# Patient Record
Sex: Male | Born: 1955
Health system: Southern US, Community
[De-identification: ages and names within clinical notes are randomized; demographics above are authoritative.]

## PROBLEM LIST (undated history)

## (undated) DIAGNOSIS — E119 Type 2 diabetes mellitus without complications: Secondary | ICD-10-CM

## (undated) DIAGNOSIS — H409 Unspecified glaucoma: Secondary | ICD-10-CM

## (undated) DIAGNOSIS — G4730 Sleep apnea, unspecified: Secondary | ICD-10-CM

## (undated) HISTORY — PX: ANTERIOR CERVICAL DISCECTOMY: SHX1160

## (undated) HISTORY — PX: KNEE ARTHROSCOPY: SUR90

## (undated) HISTORY — DX: Sleep apnea, unspecified: G47.30

## (undated) HISTORY — DX: Type 2 diabetes mellitus without complications: E11.9

---

## 1997-10-07 ENCOUNTER — Ambulatory Visit (HOSPITAL_COMMUNITY): Admission: RE | Admit: 1997-10-07 | Discharge: 1997-10-07 | Payer: Self-pay | Admitting: Family Medicine

## 1997-10-10 ENCOUNTER — Encounter: Payer: Self-pay | Admitting: Urology

## 1997-10-10 ENCOUNTER — Ambulatory Visit (HOSPITAL_COMMUNITY): Admission: RE | Admit: 1997-10-10 | Discharge: 1997-10-10 | Payer: Self-pay | Admitting: Urology

## 1997-10-20 ENCOUNTER — Encounter: Payer: Self-pay | Admitting: Family Medicine

## 1997-10-20 ENCOUNTER — Ambulatory Visit (HOSPITAL_COMMUNITY): Admission: RE | Admit: 1997-10-20 | Discharge: 1997-10-20 | Payer: Self-pay | Admitting: Family Medicine

## 1997-11-10 ENCOUNTER — Ambulatory Visit (HOSPITAL_COMMUNITY): Admission: RE | Admit: 1997-11-10 | Discharge: 1997-11-10 | Payer: Self-pay | Admitting: Urology

## 1997-11-10 ENCOUNTER — Encounter: Payer: Self-pay | Admitting: Urology

## 2001-05-04 ENCOUNTER — Ambulatory Visit (HOSPITAL_COMMUNITY): Admission: RE | Admit: 2001-05-04 | Discharge: 2001-05-04 | Payer: Self-pay | Admitting: Gastroenterology

## 2005-06-18 ENCOUNTER — Ambulatory Visit (HOSPITAL_BASED_OUTPATIENT_CLINIC_OR_DEPARTMENT_OTHER): Admission: RE | Admit: 2005-06-18 | Discharge: 2005-06-18 | Payer: Self-pay | Admitting: Family Medicine

## 2005-07-23 ENCOUNTER — Ambulatory Visit (HOSPITAL_BASED_OUTPATIENT_CLINIC_OR_DEPARTMENT_OTHER): Admission: RE | Admit: 2005-07-23 | Discharge: 2005-07-23 | Payer: Self-pay | Admitting: Family Medicine

## 2005-07-28 ENCOUNTER — Ambulatory Visit: Payer: Self-pay | Admitting: Internal Medicine

## 2006-12-02 ENCOUNTER — Ambulatory Visit (HOSPITAL_COMMUNITY): Admission: RE | Admit: 2006-12-02 | Discharge: 2006-12-02 | Payer: Self-pay | Admitting: Gastroenterology

## 2007-04-21 ENCOUNTER — Ambulatory Visit (HOSPITAL_COMMUNITY): Admission: RE | Admit: 2007-04-21 | Discharge: 2007-04-21 | Payer: Self-pay | Admitting: Neurological Surgery

## 2007-04-30 ENCOUNTER — Ambulatory Visit (HOSPITAL_COMMUNITY): Admission: RE | Admit: 2007-04-30 | Discharge: 2007-05-01 | Payer: Self-pay | Admitting: Neurological Surgery

## 2009-06-20 ENCOUNTER — Ambulatory Visit: Payer: Self-pay | Admitting: Cardiology

## 2009-06-20 ENCOUNTER — Observation Stay (HOSPITAL_COMMUNITY): Admission: EM | Admit: 2009-06-20 | Discharge: 2009-06-20 | Payer: Self-pay | Admitting: Emergency Medicine

## 2010-05-01 LAB — POCT CARDIAC MARKERS
Troponin i, poc: <0.05 ng/mL (ref 0.00–0.09)
Troponin i, poc: <0.05 ng/mL (ref 0.00–0.09)

## 2010-05-01 LAB — DIFFERENTIAL
Basophils Relative: 0 % (ref 0–1)
Eosinophils Absolute: 0.1 10*3/uL (ref 0.0–0.7)
Eosinophils Relative: 1 % (ref 0–5)
Monocytes Absolute: 0.6 10*3/uL (ref 0.1–1.0)
Monocytes Relative: 8 % (ref 3–12)

## 2010-05-01 LAB — POCT I-STAT, CHEM 8
BUN: 12 mg/dL (ref 6–23)
Calcium, Ion: 1.13 mmol/L (ref 1.12–1.32)
Chloride: 104 mEq/L (ref 96–112)
Glucose, Bld: 88 mg/dL (ref 70–99)

## 2010-05-01 LAB — CBC
Hemoglobin: 15.4 g/dL (ref 13.0–17.0)
MCHC: 34.1 g/dL (ref 30.0–36.0)
MCV: 87.1 fL (ref 78.0–100.0)
RBC: 5.18 MIL/uL (ref 4.22–5.81)

## 2010-05-01 LAB — CK TOTAL AND CKMB (NOT AT ARMC)
Relative Index: 1.5 (ref 0.0–2.5)
Total CK: 401 U/L — ABNORMAL HIGH (ref 7–232)

## 2010-05-01 LAB — TROPONIN I: Troponin I: 0.02 ng/mL (ref 0.00–0.06)

## 2010-06-26 NOTE — Op Note (Signed)
NAME:  Nicholas Young, Nicholas Young                ACCOUNT NO.:  [id number]   MEDICAL RECORD NO.:  [id number]          PATIENT TYPE:  AMB   LOCATION:  ENDO                         FACILITY:  [facility]   PHYSICIAN:  John C. Madilyn Fireman, M.D.    DATE OF BIRTH:  01-23-1956   DATE OF PROCEDURE:  12/02/2006  DATE OF DISCHARGE:                               OPERATIVE REPORT   PROCEDURE PERFORMED:  Colonoscopy.   INDICATIONS FOR PROCEDURE:  Family history of colon cancer in a first  degree relative.   PROCEDURE:  The patient was placed in the left lateral decubitus  position and placed on the pulse monitor with continuous low flow oxygen  delivered by nasal cannula.  He was sedated with 70 mcg IV fentanyl and  7 mg IV Versed.  The Olympus video colonoscope was inserted into the  rectum and advanced to the cecum, confirmed by transillumination of  McBurney's point and visualization of the ileocecal valve and  appendiceal orifice.  The prep was good.  The cecum, ascending,  transverse and descending colon all appeared normal with no masses,  polyps, diverticula or other mucosal abnormalities.  In the sigmoid  colon, there were several scattered diverticula, no other abnormalities.  The rectum appeared normal.  Retroflexed view of the anus revealed no  obvious internal hemorrhoids.  The scope was then withdrawn and the  patient returned to the recovery room in stable condition.  He tolerated  the procedure well.  There were no immediate complications.   IMPRESSION:  A few diverticula, otherwise, normal study.   PLAN:  Will repeat colonoscopy in five years.           ______________________________  Everardo All Madilyn Fireman, M.D.     JCH/MEDQ  D:  12/02/2006  T:  12/02/2006  Job:  [id number]   cc:   Bryan Lemma. Manus Gunning, M.D.  Fax: [phone]

## 2010-06-26 NOTE — Op Note (Signed)
NAME:  JRU, PENSE NO.:  [id number]   MEDICAL RECORD NO.:  [id number]          PATIENT TYPE:  OIB   LOCATION:  3533                         FACILITY:  [facility]   PHYSICIAN:  Stefani Dama, M.D.  DATE OF BIRTH:  May 28, 1955   DATE OF PROCEDURE:  04/30/2007  DATE OF DISCHARGE:                               OPERATIVE REPORT   PREOPERATIVE DIAGNOSIS:  Cervical spondylosis with myelopathy and  radiculopathy C3-4, C4-5 and C5-6.   POSTOPERATIVE DIAGNOSIS:  Cervical spondylosis with myelopathy and  radiculopathy C3-4, C4-5 and C5-6.   OPERATION:  1. Anterior cervical decompression C3-4, C4-5, and C5-6.  2. Arthroplasty with Synthes pro disk C3-4.  3. Arthrodesis C4-5 and C5-6 with structural allograft and alpha-type      plate fixation Z6-X0.   SURGEON:  Stefani Dama, M.D.   FIRST ASSISTANT:  Cristi Loron, M.D.   ANESTHESIA:  General endotracheal.   INDICATIONS:  Nicholas Young is a 55 year old individual who has had  significant problems with pain, numbness, and weakness now on the right  upper extremity.  The numbness has been going on for a period of months,  but his weakness was more recently noted in the past 4 or 5 weeks.  He  was found to have severe spondylitic myelopathy with cord compression at  the level of C3-4 and C4-5.  He has moderate central canal stenosis at  C5-C6 with by foraminal stenosis of the C6 nerve roots.  After  evaluation was noted that he had weakness in both the C5 and C6  distributions, he was advised regarding surgical decompression  arthrodesis.   PROCEDURE:  The patient was brought to the operating room, placed on the  table in the supine position.  After smooth induction of general  endotracheal anesthesia he was placed in 5 pounds of halter traction,  and the neck was carefully prepped with alcohol and DuraPrep and draped  in a sterile fashion.  A transverse incision was made in the superior  portion of neck after  localizing the area on the skin just above the  level of C3-4.  Dissection was taken down through the platysma and the  plane between the sternocleidomastoid and the strap muscles was then  dissected until the prevertebral space was reached.  The disk space at  C3-C4 was identified, and localized both in the AP and lateral  projections using needle localization with fluoroscopy.  The midline was  carefully marked at this level.  The self-retaining retractors with the  Caspar blades being used, under the longus coli muscle at the level of  C3-C4 were placed laterally, and a blunt Caspar distractor was placed  superiorly-inferiorly.   End plates were then chosen, again, using fluoroscopic placement to  assure that pins were being placed in the superior portion of the  vertebral body of C3, and the inferior portion of the vertebral body of  C4.  Pin distractors were then applied, and a slight amount of  distraction was applied.  The anterior longitudinal ligament was then  opened with a #15 blade, and a  complete diskectomy was then performed at  the C3-C4 level using a combination of the Kerrison rongeurs to remove a  substantial quantity of severely degenerated disk material.  In the  region of the posterior longitudinal ligament there was noted to be a  breech in the ligament itself, and there were free fragments of disk  material in the epidural space.  These were retrieved with a small nerve  hook and a micro pituitary.  The disk space was cleaned of overgrown  ligament, and the lateral aspect on the right side was taken up with a 1  mm and then a 2 mm Kerrison punch.  Care was taken to make sure that the  lateral recess was well decompressed, particularly on the right side.  The left side was decompressed also taking up the ligament, and making  sure that the dura was well decompressed.   Once the decompression portion was accomplished, the high-speed drill  was used to drill off some  of the irregularities in the bony edges  particularly from the inferior margin body of body of C3.  With the  osteophytes being removed, the interspace was sized, and it was felt  that a large spacer, 5 mm in height with the deep 16 mm contour would  fit best into this interspace.  The trial was then placed into the  interspace and tamped and countersunk appropriately.  Once this spacer  was placed well, and no adjustments were needed, the distraction was  released along the spacer.   Keel cuts were then made with the keel cutting drill bit, the superior  and inferior keels.  The keels were then checked for their completeness  of cut; and once this was ascertained, the arthroplasty implant was  placed into the interspace.  Again, this was a large implant with a 16  mm deep AP measurement.  The height was 5 mm tall.  The implant was  driven and countersunk using fluoroscopic guidance.  Once it was placed,  the applicator tool was released from the implant.  Final localizing  radiographs were obtained in the AP and lateral projection.  At this  point, some bone wax was placed over the exposed anterior bony edges,  and then attention was turned to C4-C5 and C5-6.  At C4-5 there was some  ventral osteophytes that were resected and the C4-5 disk space was  entered after placing a self-retaining Caspar retractor in this area.  The complete diskectomy was completed with an aggressive lateral  decompression of osteophyte from the region of the uncinate process.  The left side was similarly decompressed.  Here an 8-mm trans graft was  cut and sized into the appropriate shape to fit into the interspace.  It  was filled with fortoss bone substitute.   Attention was then turned to C5-C6 where a similar diskectomy was  carried out.  Here there was noted to be biforaminal stenosis, and  aggressive diskectomy and foraminotomy was created removing uncinate  spurs from C6 primarily.  Again, once this  disk space was completed, an  8 mm size trans graft was cut, sized, and filled with fortoss and then  placed into the interspace.  This procedure was done without the aid of  any traction.  Traction was removed at the time of the arthroplasty  being performed at C3-4.  With bone grafts being placed, the area was  sized for a 37 mm standard size Alphatec trestle plate, and the plate  was  secured in the ventral aspect of the vertebral bodies with variable  angle 14 mm screws.   Hemostasis was then carefully checked in the tissues.  Final localizing  radiograph identified good position of the graft, in the arthroplasty.  Then the platysma was closed with 3-0 Vicryl in an interrupted fashion,  3-0 Vicryl was used in the subcuticular tissues, and a dry sterile  dressing was placed on the skin.  The patient tolerated the procedure  well and was returned to the recovery room in stable condition.      Stefani Dama, M.D.  Electronically Signed     HJE/MEDQ  D:  04/30/2007  T:  04/30/2007  Job:  [id number]

## 2010-06-29 NOTE — Procedures (Signed)
NAME:  Nicholas Young, Nicholas Young                ACCOUNT NO.:  [id number]   MEDICAL RECORD NO.:  [id number]          PATIENT TYPE:  OUT   LOCATION:  SLEEP CENTER                 FACILITY:  [facility]   PHYSICIAN:  Clinton D. Maple Hudson, M.D. DATE OF BIRTH:  03-02-1955   DATE OF STUDY:  07/23/2005                              NOCTURNAL POLYSOMNOGRAM   REFERRING PHYSICIAN:  Dr. Blair Heys.   INDICATIONS FOR PROCEDURE:  Hypersomnia with sleep apnea.   EPWORTH SLEEPINESS SCORE:  10/24.   BMI:  36.   WEIGHT:  260 pounds.   HOME MEDICATION:  Citalopram, Rhinocort Aqua.   A baseline diagnostic NPSG on 06/18/2005 recorded an AHI of 108.2 per hour.  CPAP titration is requested.   SLEEP ARCHITECTURE:  Total sleep time 401 minutes with sleep efficiency 86%.  Stage I was 3%, stage II 66%, stages III and IV 11%, REM 21% of total sleep  time.  Sleep latency 27 minutes, REM latency 233 minutes, awake after sleep  onset 38 minutes, arousal index 9.4.  No sleep medication was reported.   RESPIRATORY DATA:  CPAP titration protocol.  CPAP was titrated to 14 CWP, as  a AHI 2.6 per hour.  He used his own medium full-face ultra mirage mask with  heated humidifier.   OXYGEN DATA:  Snoring was controlled and oxygen saturation maintained at 94-  96% on room air while on CPAP.   CARDIAC DATA:  Normal sinus rhythm.   MOVEMENT/PARASOMNIA:  Frequent limb jerks with a total of 172 reported of  which only five were associated with arousal or awakening for an  insignificant periodic limb movement with arousal index of 0.7 per hour.   IMPRESSION/RECOMMENDATIONS:  1.  Successful CPAP titration to 14 CWP, AHI 2.6 per hour.  A medium full-      face ultra mirage mask was used      with heated humidity.  2.  Baseline diagnostic NPSG on 06/18/2005 had recorded an AHI of 108.2 per      hour.      Clinton D. Maple Hudson, M.D.  Diplomate, Biomedical engineer of Sleep Medicine  Electronically Signed     CDY/MEDQ  D:  07/28/2005  09:19:59  T:  07/29/2005 10:25:34  Job:  [id number]

## 2010-06-29 NOTE — Procedures (Signed)
NAME:  Nicholas Young, Nicholas Young                ACCOUNT NO.:  [id number]   MEDICAL RECORD NO.:  [id number]          PATIENT TYPE:  OUT   LOCATION:  SLEEP CENTER                 FACILITY:  [facility]   PHYSICIAN:  Clinton D. Maple Hudson, M.D. DATE OF BIRTH:  July 29, 1955   DATE OF STUDY:                              NOCTURNAL POLYSOMNOGRAM   INDICATION FOR STUDY:  Hypersomnia with sleep apnea.   Epworth sleepiness score 11/24, BMI 20.6.  Weight 265 pounds.   HOME MEDICATIONS:  1.  Lexapro.  2.  Rhinocort.   NPSG diagnostic protocol was requested.   SLEEP ARCHITECTURE:  Total sleep time 370 minutes with sleep efficiency 93%.  Stage I was 10%, stage II 79%, stages III and IV were absent.  REM was 11%  of total sleep time.  Sleep latency 15 minutes, REM latency 313 minutes,  awake after sleep onset 19 minutes, arousal index increased at 76.1  indicating increased fragmentation.  No bedtime medication was reported.   RESPIRATORY DATA:  Apnea/hypopnea index (AHI, RDI) 108.2 obstructive events  per hour indicating severe obstructive sleep apnea/hypopnea syndrome.  There  were 2 central apneas, 453 obstructive apneas, 1 mixed apnea and 211  hypopneas.  Events were not positional.  REM AHI 110 per hour, diagnostic  NPSG protocol.   OXYGEN DATA:  Moderate to loud continuous snoring with oxygen desaturation  to a nadir of 82%.   CARDIAC DATA:  Normal sinus rhythm.   MOVEMENT/PARASOMNIA:  Occasional limb jerk with little effect on sleep.   IMPRESSION/RECOMMENDATIONS:  1.  Severe obstructive sleep apnea/hypopnea syndrome, AHI 108.2 per hour      with non-positional events, moderate      to loud snoring, and oxygen desaturation to a nadir of 82%.  2.  Consider return for CPAP titration or evaluate for alternative therapies      as appropriate.      Clinton D. Maple Hudson, M.D.  Diplomate, Biomedical engineer of Sleep Medicine  Electronically Signed     CDY/MEDQ  D:  06/23/2005 11:16:54  T:  06/24/2005 10:38:15   Job:  [id number]

## 2010-06-29 NOTE — Procedures (Signed)
[facility]  Patient:    Nicholas Young, Nicholas Young Visit Number: [id number] MRN: [id number]          Service Type: Attending:  Everardo All. Madilyn Fireman, M.D. Dictated by:   Everardo All Madilyn Fireman, M.D. Proc. Date: 05/04/01   CC:         Dyanne Carrel, M.D.   Procedure Report  PROCEDURE:  Colonoscopy.  INDICATION FOR PROCEDURE:  Family history of colon cancer in a first degree relative.  DESCRIPTION OF PROCEDURE:   The patient was placed in the left lateral decubitus position and placed on the pulse monitor with continuous low flow oxygen delivered via nasal cannula. He was sedated with 100 mg IV Demerol and 6 mg IV Versed. The Olympus video colonoscope was inserted into the rectum and advanced to the cecum, confirmed by transillumination at McBurneys point and visualization of the ileocecal valve and appendiceal orifice. The prep was good. The terminal ileum was intubated and explored for several centimeters and appeared to be within normal limits. The cecum, ascending, transverse, descending, and sigmoid colon all appeared normal with no masses, polyps, diverticula, or other mucosal abnormalities. The rectum likewise appeared normal and retroflex view of the anus revealed no obvious internal hemorrhoids. The colonoscope was then withdrawn and the patient returned to the recovery room in stable condition. He tolerated the procedure well and there were no immediate complications.  IMPRESSION:  Essentially normal colonoscopy.  PLAN:  Repeat colonoscopy in five years based on his family history. Dictated by:   Everardo All Madilyn Fireman, M.D. Attending:  Everardo All. Madilyn Fireman, M.D. DD:  05/04/01 TD:  05/04/01 Job: [id number]

## 2010-08-15 ENCOUNTER — Inpatient Hospital Stay (INDEPENDENT_AMBULATORY_CARE_PROVIDER_SITE_OTHER)
Admission: RE | Admit: 2010-08-15 | Discharge: 2010-08-15 | Disposition: A | Discharge status: Home / Self Care | Payer: 59 | Source: Ambulatory Visit | Attending: Family Medicine | Admitting: Family Medicine

## 2010-08-15 DIAGNOSIS — L02419 Cutaneous abscess of limb, unspecified: Secondary | ICD-10-CM

## 2010-08-15 DIAGNOSIS — L03119 Cellulitis of unspecified part of limb: Secondary | ICD-10-CM

## 2010-08-15 LAB — DIFFERENTIAL
Eosinophils Relative: 3 % (ref 0–5)
Lymphocytes Relative: 18 % (ref 12–46)
Lymphs Abs: 1.3 10*3/uL (ref 0.7–4.0)
Monocytes Absolute: 1.1 10*3/uL — ABNORMAL HIGH (ref 0.1–1.0)
Neutro Abs: 4.5 10*3/uL (ref 1.7–7.7)

## 2010-08-15 LAB — CBC
HCT: 41.2 % (ref 39.0–52.0)
Hemoglobin: 14.2 g/dL (ref 13.0–17.0)
MCV: 84.6 fL (ref 78.0–100.0)
RDW: 14.2 % (ref 11.5–15.5)
WBC: 7.1 10*3/uL (ref 4.0–10.5)

## 2010-08-17 ENCOUNTER — Inpatient Hospital Stay (INDEPENDENT_AMBULATORY_CARE_PROVIDER_SITE_OTHER)
Admission: RE | Admit: 2010-08-17 | Discharge: 2010-08-17 | Disposition: A | Discharge status: Home / Self Care | Payer: 59 | Source: Ambulatory Visit | Attending: Family Medicine | Admitting: Family Medicine

## 2010-08-17 DIAGNOSIS — M79609 Pain in unspecified limb: Secondary | ICD-10-CM

## 2010-08-17 DIAGNOSIS — M25469 Effusion, unspecified knee: Secondary | ICD-10-CM

## 2010-08-23 ENCOUNTER — Encounter (HOSPITAL_COMMUNITY): Payer: 59 | Attending: Orthopedic Surgery

## 2010-08-23 DIAGNOSIS — L02419 Cutaneous abscess of limb, unspecified: Secondary | ICD-10-CM | POA: Insufficient documentation

## 2010-08-24 ENCOUNTER — Encounter (HOSPITAL_COMMUNITY): Payer: 59

## 2010-08-27 ENCOUNTER — Encounter (HOSPITAL_COMMUNITY): Payer: 59

## 2010-09-24 ENCOUNTER — Inpatient Hospital Stay (INDEPENDENT_AMBULATORY_CARE_PROVIDER_SITE_OTHER)
Admission: RE | Admit: 2010-09-24 | Discharge: 2010-09-24 | Disposition: A | Discharge status: Home / Self Care | Payer: 59 | Source: Ambulatory Visit | Attending: Family Medicine | Admitting: Family Medicine

## 2010-09-24 DIAGNOSIS — H6690 Otitis media, unspecified, unspecified ear: Secondary | ICD-10-CM

## 2010-11-05 LAB — CBC
HCT: 39.9
Hemoglobin: 13.7
MCHC: 34.4
MCV: 86.1
RBC: 4.63

## 2014-05-12 ENCOUNTER — Encounter: Payer: Self-pay | Admitting: Dietician

## 2014-05-12 ENCOUNTER — Encounter: Payer: 59 | Attending: Family Medicine | Admitting: Dietician

## 2014-05-12 VITALS — Ht 71.0 in | Wt 278.2 lb

## 2014-05-12 DIAGNOSIS — E669 Obesity, unspecified: Secondary | ICD-10-CM | POA: Insufficient documentation

## 2014-05-12 DIAGNOSIS — Z6838 Body mass index (BMI) 38.0-38.9, adult: Secondary | ICD-10-CM | POA: Insufficient documentation

## 2014-05-12 DIAGNOSIS — Z713 Dietary counseling and surveillance: Secondary | ICD-10-CM | POA: Insufficient documentation

## 2014-05-12 NOTE — Patient Instructions (Signed)
Think about bringing lunch to work to give you more time to eat. Have a protein the size of the palm of your hand, starch on quarter of your plate and add vegetables to side. If you are hungry, have portioned out snacks with protein and carbs (see list). Think about using a small plate. Try to take 20 minutes to eat. Chew well, put your fork down between bites, eat without multitasking. If you are still hungry after 20 minutes, have seconds. Plan to walk 5 x week for 30 minutes.

## 2014-05-12 NOTE — Progress Notes (Signed)
  Medical Nutrition Therapy:  Appt start time: 1520 end time:  1600.   Assessment:  Primary concerns today: Nicholas Young is here today for help with weight loss and his Hgb A1c has going up ( 6.3% back in the fall). Has not made any major changes to diet or exercise recently. Would like to get weight down to 220 lbs. Has been at his current weight for the last few years and has gone up gradually over the past 20 lbs. Has lost weight a few years ago by decreasing portions and increasing portions. Has episodes of reactive hypoglycemia when he has something sweet.   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 Lives with his wife and both of them prepare meals. Tries not to miss meals and eats about 8-9 meals per week.   Feels like he needs to work on his portion sizes.   Preferred Learning Style:   No preference indicated   Learning Readiness:   Ready  MEDICATIONS: see list   DIETARY INTAKE:  Usual eating pattern includes 3 meals and 0-1 snacks per day.  Avoided foods include liver.    24-hr recall:  B ( AM): slim fast meal bar with black coffee Snk ( AM): none  L ( PM): cafeteria - salad with mixed greens sometimes with tuna or sandwich with a piece of fruit Snk ( PM): none D ( PM): hamburger and fries or chopped salad  Snk ( PM):sometimes might have popcorn Beverages: water  Usual physical activity: walking about 2 x week for 15-20 minutes  Estimated energy needs: 2000 calories 225 g carbohydrates 150 g protein 56 g fat  Progress Towards Goal(s):  In progress.   Nutritional Diagnosis:  -3.3 Overweight/obesity As related to hx of large portion sizes and physical inactivity.  As evidenced by BMI of 38.8 and elevated Hgb A1c.    Intervention:  Nutrition counseling provided. Plan: Think about bringing lunch to work to give you more time to eat. Have a protein the size of the palm of your hand, starch on quarter of your plate and add vegetables to side. If you are hungry,  have portioned out snacks with protein and carbs (see list). Think about using a small plate. Try to take 20 minutes to eat. Chew well, put your fork down between bites, eat without multitasking. If you are still hungry after 20 minutes, have seconds. Plan to walk 5 x week for 30 minutes.   Teaching Method Utilized:  Visual Auditory Hands on  Handouts given during visit include:  MyPlate Handout  15 g CHO Snacks  Meal Card  Barriers to learning/adherence to lifestyle change: none  Demonstrated degree of understanding via:  Teach Back   Monitoring/Evaluation:  Dietary intake, exercise, and body weight in 6 week(s).

## 2014-07-07 ENCOUNTER — Encounter: Payer: 59 | Attending: Family Medicine | Admitting: Dietician

## 2014-07-07 ENCOUNTER — Encounter: Payer: Self-pay | Admitting: Dietician

## 2014-07-07 VITALS — Ht 71.0 in | Wt 268.0 lb

## 2014-07-07 DIAGNOSIS — E669 Obesity, unspecified: Secondary | ICD-10-CM | POA: Insufficient documentation

## 2014-07-07 DIAGNOSIS — Z6838 Body mass index (BMI) 38.0-38.9, adult: Secondary | ICD-10-CM | POA: Diagnosis not present

## 2014-07-07 DIAGNOSIS — Z713 Dietary counseling and surveillance: Secondary | ICD-10-CM | POA: Insufficient documentation

## 2014-07-07 NOTE — Patient Instructions (Addendum)
Continue to have protein the size of the palm of your hand, starch on quarter of your plate and add vegetables to side. Continue having snacks with protein and carbs available. Take take 20 minutes to eat. Chew well, put your fork down between bites, eat without multitasking when possible. If you are still hungry after 20 minutes, have seconds. Plan to walk 6 x week for 30 minutes.

## 2014-07-07 NOTE — Progress Notes (Signed)
  Medical Nutrition Therapy:  Appt start time: 1000 end time:  1010.   Assessment:  Primary concerns today: Prem is here today for help with weight loss and his Hgb A1c has going up ( 6.3% back in the fall). Returns with a 10 lbs weight loss since 3/31. Had another Hgb A1c test done this morning (not sure of result yet). Has been walking every evening and trying to watch portions and increase vegetables. Has added snacks during the day. Has not brought lunch to work and is not sure he will be able to do that.   Overall doing great and happy with what he is doing.   Preferred Learning Style:   No preference indicated   Learning Readiness:   Ready  MEDICATIONS: see list   DIETARY INTAKE:  Usual eating pattern includes 3 meals and 0-1 snacks per day.  Avoided foods include liver.    24-hr recall:  B ( AM): slim fast meal bar with black coffee Snk ( AM): oikos triple zero or [employer] L ( PM): cafeteria - salad with mixed greens sometimes with tuna or sandwich with a piece of fruit Snk ( PM): oikos triple zero or [employer] D ( PM): salad with fish or chicken, broccoli or cauliflower Snk ( PM):none or oikos triple zero or [employer] Beverages: water  Usual physical activity: walking about 6 x week for 30 minutes  Estimated energy needs: 2000 calories 225 g carbohydrates 150 g protein 56 g fat  Progress Towards Goal(s):  In progress.   Nutritional Diagnosis:  -3.3 Overweight/obesity As related to hx of large portion sizes and physical inactivity.  As evidenced by BMI of 38.8 and elevated Hgb A1c.    Intervention:  Nutrition counseling provided. Plan: Think about bringing lunch to work to give you more time to eat. Have a protein the size of the palm of your hand, starch on quarter of your plate and add vegetables to side. If you are hungry, have portioned out snacks with protein and carbs (see list). Think about using a  small plate. Try to take 20 minutes to eat. Chew well, put your fork down between bites, eat without multitasking. If you are still hungry after 20 minutes, have seconds. Plan to walk 5 x week for 30 minutes.   Teaching Method Utilized:  Visual Auditory Hands on  Barriers to learning/adherence to lifestyle change: none  Demonstrated degree of understanding via:  Teach Back   Monitoring/Evaluation:  Dietary intake, exercise, and body weight prn.

## 2015-04-24 DIAGNOSIS — J101 Influenza due to other identified influenza virus with other respiratory manifestations: Secondary | ICD-10-CM | POA: Diagnosis not present

## 2015-05-15 DIAGNOSIS — H2512 Age-related nuclear cataract, left eye: Secondary | ICD-10-CM | POA: Diagnosis not present

## 2015-05-15 DIAGNOSIS — H401130 Primary open-angle glaucoma, bilateral, stage unspecified: Secondary | ICD-10-CM | POA: Diagnosis not present

## 2015-05-15 DIAGNOSIS — H527 Unspecified disorder of refraction: Secondary | ICD-10-CM | POA: Diagnosis not present

## 2015-05-15 DIAGNOSIS — H2511 Age-related nuclear cataract, right eye: Secondary | ICD-10-CM | POA: Diagnosis not present

## 2015-09-12 DIAGNOSIS — H353111 Nonexudative age-related macular degeneration, right eye, early dry stage: Secondary | ICD-10-CM | POA: Diagnosis not present

## 2015-09-12 DIAGNOSIS — H35712 Central serous chorioretinopathy, left eye: Secondary | ICD-10-CM | POA: Diagnosis not present

## 2015-09-12 DIAGNOSIS — H35362 Drusen (degenerative) of macula, left eye: Secondary | ICD-10-CM | POA: Diagnosis not present

## 2015-09-12 DIAGNOSIS — H353221 Exudative age-related macular degeneration, left eye, with active choroidal neovascularization: Secondary | ICD-10-CM | POA: Diagnosis not present

## 2015-09-12 DIAGNOSIS — H35722 Serous detachment of retinal pigment epithelium, left eye: Secondary | ICD-10-CM | POA: Diagnosis not present

## 2015-10-10 DIAGNOSIS — H353221 Exudative age-related macular degeneration, left eye, with active choroidal neovascularization: Secondary | ICD-10-CM | POA: Diagnosis not present

## 2015-11-16 DIAGNOSIS — H353221 Exudative age-related macular degeneration, left eye, with active choroidal neovascularization: Secondary | ICD-10-CM | POA: Diagnosis not present

## 2015-12-15 DIAGNOSIS — H353221 Exudative age-related macular degeneration, left eye, with active choroidal neovascularization: Secondary | ICD-10-CM | POA: Diagnosis not present

## 2016-01-18 DIAGNOSIS — H353221 Exudative age-related macular degeneration, left eye, with active choroidal neovascularization: Secondary | ICD-10-CM | POA: Diagnosis not present

## 2016-01-22 DIAGNOSIS — H3530 Unspecified macular degeneration: Secondary | ICD-10-CM | POA: Diagnosis not present

## 2016-01-22 DIAGNOSIS — E669 Obesity, unspecified: Secondary | ICD-10-CM | POA: Diagnosis not present

## 2016-01-22 DIAGNOSIS — Z6838 Body mass index (BMI) 38.0-38.9, adult: Secondary | ICD-10-CM | POA: Diagnosis not present

## 2016-01-22 DIAGNOSIS — Z125 Encounter for screening for malignant neoplasm of prostate: Secondary | ICD-10-CM | POA: Diagnosis not present

## 2016-01-22 DIAGNOSIS — Z0000 Encounter for general adult medical examination without abnormal findings: Secondary | ICD-10-CM | POA: Diagnosis not present

## 2016-01-22 DIAGNOSIS — G4733 Obstructive sleep apnea (adult) (pediatric): Secondary | ICD-10-CM | POA: Diagnosis not present

## 2016-01-22 DIAGNOSIS — E7800 Pure hypercholesterolemia, unspecified: Secondary | ICD-10-CM | POA: Diagnosis not present

## 2016-01-22 DIAGNOSIS — H409 Unspecified glaucoma: Secondary | ICD-10-CM | POA: Diagnosis not present

## 2016-01-22 DIAGNOSIS — F322 Major depressive disorder, single episode, severe without psychotic features: Secondary | ICD-10-CM | POA: Diagnosis not present

## 2016-01-22 DIAGNOSIS — R7303 Prediabetes: Secondary | ICD-10-CM | POA: Diagnosis not present

## 2016-01-29 DIAGNOSIS — H5203 Hypermetropia, bilateral: Secondary | ICD-10-CM | POA: Diagnosis not present

## 2016-02-19 DIAGNOSIS — H353221 Exudative age-related macular degeneration, left eye, with active choroidal neovascularization: Secondary | ICD-10-CM | POA: Diagnosis not present

## 2016-03-25 DIAGNOSIS — H35722 Serous detachment of retinal pigment epithelium, left eye: Secondary | ICD-10-CM | POA: Diagnosis not present

## 2016-03-25 DIAGNOSIS — H353111 Nonexudative age-related macular degeneration, right eye, early dry stage: Secondary | ICD-10-CM | POA: Diagnosis not present

## 2016-03-25 DIAGNOSIS — H353221 Exudative age-related macular degeneration, left eye, with active choroidal neovascularization: Secondary | ICD-10-CM | POA: Diagnosis not present

## 2016-04-25 ENCOUNTER — Encounter (HOSPITAL_COMMUNITY): Payer: Self-pay | Admitting: Emergency Medicine

## 2016-04-25 ENCOUNTER — Ambulatory Visit (HOSPITAL_COMMUNITY)
Admission: EM | Admit: 2016-04-25 | Discharge: 2016-04-25 | Disposition: A | Discharge status: Home / Self Care | Payer: 59 | Attending: Internal Medicine | Admitting: Internal Medicine

## 2016-04-25 DIAGNOSIS — B9789 Other viral agents as the cause of diseases classified elsewhere: Secondary | ICD-10-CM

## 2016-04-25 DIAGNOSIS — B349 Viral infection, unspecified: Secondary | ICD-10-CM | POA: Diagnosis not present

## 2016-04-25 DIAGNOSIS — J069 Acute upper respiratory infection, unspecified: Secondary | ICD-10-CM

## 2016-04-25 HISTORY — DX: Unspecified glaucoma: H40.9

## 2016-04-25 NOTE — ED Provider Notes (Signed)
CSN: [id number]     Arrival date & time 04/25/16  1800 History   First MD Initiated Contact with Patient 04/25/16 1850     Chief Complaint  Patient presents with  . URI   (Consider location/radiation/quality/duration/timing/severity/associated sxs/prior Treatment) 61 year old male status a he began to feel bad with some fatigue and malaise. Only other complaint is minor cough. He wanted to make sure he did not have the flu. Temperature is 99.3.      Past Medical History:  Diagnosis Date  . Glaucoma   . Sleep apnea    Past Surgical History:  Procedure Laterality Date  . ANTERIOR CERVICAL DISCECTOMY    . KNEE ARTHROSCOPY     No family history on file. Social History  Substance Use Topics  . Smoking status: Never Smoker  . Smokeless tobacco: Not on file  . Alcohol use Yes     Comment: 1-2 beers a week    Review of Systems  Constitutional: Positive for activity change. Negative for chills and fever.  HENT: Negative for congestion, rhinorrhea, sore throat and trouble swallowing.   Eyes: Negative.   Respiratory: Positive for cough.   Cardiovascular: Negative.   Gastrointestinal: Negative.   Skin: Negative.   Neurological: Negative.   All other systems reviewed and are negative.   Allergies  Patient has no known allergies.  Home Medications   Prior to Admission medications   Medication Sig Start Date End Date Taking? Authorizing Provider  Aflibercept (EYLEA IO) Inject into the eye.   Yes Historical Provider, MD  brimonidine-timolol (COMBIGAN) 0.2-0.5 % ophthalmic solution Place 1 drop into both eyes every 12 (twelve) hours.   Yes Historical Provider, MD  Budesonide (RHINOCORT ALLERGY NA) Place into the nose.    Historical Provider, MD  citalopram (CELEXA) 40 MG tablet Take 40 mg by mouth daily.    Historical Provider, MD  glucosamine-chondroitin 500-400 MG tablet Take 1 tablet by mouth 3 (three) times daily.    Historical Provider, MD  Multiple Vitamins-Minerals  (PRESERVISION AREDS PO) Take by mouth.    Historical Provider, MD   Meds Ordered and Administered this Visit  Medications - No data to display  BP (!) 108/53 (BP Location: Left Arm) Comment: notified rn  Pulse 84   Temp 99.3 F (37.4 C)   Resp 16   SpO2 96%  No data found.   Physical Exam  Constitutional: He is oriented to person, place, and time. He appears well-developed and well-nourished. No distress.  HENT:  Mouth/Throat: No oropharyngeal exudate.  Bilateral TMs are mildly retracted. No erythema. Oropharynx with clear PND  Eyes: EOM are normal.  Neck: Neck supple.  Cardiovascular: Normal rate, regular rhythm, normal heart sounds and intact distal pulses.   Pulmonary/Chest: Effort normal and breath sounds normal. No respiratory distress. He has no wheezes.  Musculoskeletal: Normal range of motion. He exhibits no edema.  Lymphadenopathy:    He has no cervical adenopathy.  Neurological: He is alert and oriented to person, place, and time.  Skin: Skin is warm and dry. No rash noted.  Psychiatric: He has a normal mood and affect.  Nursing note and vitals reviewed.   Urgent Care Course     Procedures (including critical care time)  Labs Review Labs Reviewed - No data to display  Imaging Review No results found.   Visual Acuity Review  Right Eye Distance:   Left Eye Distance:   Bilateral Distance:    Right Eye Near:   Left Eye Near:  Bilateral Near:         MDM   1. Viral syndrome   2. Viral upper respiratory tract infection    Drink plenty of fluids and stay well-hydrated. Take Tylenol every 4 hours as needed. Get plenty of rest, as much as possible. For drainage in the back of the throat take an anti-histamine such as Zyrtec or Allegra.     Janne Napoleon, NP 04/25/16 1904

## 2016-04-25 NOTE — ED Triage Notes (Signed)
Felt bad in general yesterday, sore throat, productive cough and fever.  Reports temp of 100.6

## 2016-04-25 NOTE — Discharge Instructions (Signed)
Drink plenty of fluids and stay well-hydrated. Take Tylenol every 4 hours as needed. Get plenty of rest, as much as possible. For drainage in the back of the throat take an anti-histamine such as Zyrtec or Allegra.

## 2016-05-02 MED FILL — COMBIGAN EYE DROPS: 0.2-0.5 | 25 days supply | Qty: 5 | Fill #0

## 2016-05-09 DIAGNOSIS — H353221 Exudative age-related macular degeneration, left eye, with active choroidal neovascularization: Secondary | ICD-10-CM | POA: Diagnosis not present

## 2016-05-30 MED FILL — COMBIGAN EYE DROPS: 0.2-0.5 | 25 days supply | Qty: 5 | Fill #1

## 2016-06-06 DIAGNOSIS — H401131 Primary open-angle glaucoma, bilateral, mild stage: Secondary | ICD-10-CM | POA: Diagnosis not present

## 2016-06-06 DIAGNOSIS — H35719 Central serous chorioretinopathy, unspecified eye: Secondary | ICD-10-CM | POA: Diagnosis not present

## 2016-06-10 DIAGNOSIS — H353221 Exudative age-related macular degeneration, left eye, with active choroidal neovascularization: Secondary | ICD-10-CM | POA: Diagnosis not present

## 2016-06-28 MED FILL — COMBIGAN EYE DROPS: 0.2-0.5 | 75 days supply | Qty: 15 | Fill #0

## 2016-07-16 DIAGNOSIS — H353122 Nonexudative age-related macular degeneration, left eye, intermediate dry stage: Secondary | ICD-10-CM | POA: Diagnosis not present

## 2016-07-16 DIAGNOSIS — H353111 Nonexudative age-related macular degeneration, right eye, early dry stage: Secondary | ICD-10-CM | POA: Diagnosis not present

## 2016-07-16 DIAGNOSIS — H35722 Serous detachment of retinal pigment epithelium, left eye: Secondary | ICD-10-CM | POA: Diagnosis not present

## 2016-07-16 DIAGNOSIS — H353221 Exudative age-related macular degeneration, left eye, with active choroidal neovascularization: Secondary | ICD-10-CM | POA: Diagnosis not present

## 2016-07-16 DIAGNOSIS — H35712 Central serous chorioretinopathy, left eye: Secondary | ICD-10-CM | POA: Diagnosis not present

## 2016-08-27 DIAGNOSIS — H353221 Exudative age-related macular degeneration, left eye, with active choroidal neovascularization: Secondary | ICD-10-CM | POA: Diagnosis not present

## 2016-09-30 MED FILL — COMBIGAN EYE DROPS: 0.2-0.5 | 75 days supply | Qty: 15 | Fill #1

## 2016-10-22 DIAGNOSIS — H353221 Exudative age-related macular degeneration, left eye, with active choroidal neovascularization: Secondary | ICD-10-CM | POA: Diagnosis not present

## 2016-10-22 DIAGNOSIS — H353111 Nonexudative age-related macular degeneration, right eye, early dry stage: Secondary | ICD-10-CM | POA: Diagnosis not present

## 2016-10-22 DIAGNOSIS — H35722 Serous detachment of retinal pigment epithelium, left eye: Secondary | ICD-10-CM | POA: Diagnosis not present

## 2016-10-22 DIAGNOSIS — H35712 Central serous chorioretinopathy, left eye: Secondary | ICD-10-CM | POA: Diagnosis not present

## 2016-11-25 DIAGNOSIS — H353221 Exudative age-related macular degeneration, left eye, with active choroidal neovascularization: Secondary | ICD-10-CM | POA: Diagnosis not present

## 2016-11-25 DIAGNOSIS — H35712 Central serous chorioretinopathy, left eye: Secondary | ICD-10-CM | POA: Diagnosis not present

## 2016-11-25 DIAGNOSIS — H353111 Nonexudative age-related macular degeneration, right eye, early dry stage: Secondary | ICD-10-CM | POA: Diagnosis not present

## 2016-11-25 DIAGNOSIS — H35722 Serous detachment of retinal pigment epithelium, left eye: Secondary | ICD-10-CM | POA: Diagnosis not present

## 2017-01-14 DIAGNOSIS — H353111 Nonexudative age-related macular degeneration, right eye, early dry stage: Secondary | ICD-10-CM | POA: Diagnosis not present

## 2017-01-14 DIAGNOSIS — H35712 Central serous chorioretinopathy, left eye: Secondary | ICD-10-CM | POA: Diagnosis not present

## 2017-01-14 DIAGNOSIS — H35722 Serous detachment of retinal pigment epithelium, left eye: Secondary | ICD-10-CM | POA: Diagnosis not present

## 2017-01-14 DIAGNOSIS — H353221 Exudative age-related macular degeneration, left eye, with active choroidal neovascularization: Secondary | ICD-10-CM | POA: Diagnosis not present

## 2017-02-07 MED FILL — COMBIGAN EYE DROPS: 0.2-0.5 | 75 days supply | Qty: 15 | Fill #2

## 2017-03-11 DIAGNOSIS — H2512 Age-related nuclear cataract, left eye: Secondary | ICD-10-CM | POA: Diagnosis not present

## 2017-03-11 DIAGNOSIS — G4733 Obstructive sleep apnea (adult) (pediatric): Secondary | ICD-10-CM | POA: Diagnosis not present

## 2017-03-11 DIAGNOSIS — H353221 Exudative age-related macular degeneration, left eye, with active choroidal neovascularization: Secondary | ICD-10-CM | POA: Diagnosis not present

## 2017-03-11 DIAGNOSIS — H35722 Serous detachment of retinal pigment epithelium, left eye: Secondary | ICD-10-CM | POA: Diagnosis not present

## 2017-03-11 DIAGNOSIS — H353111 Nonexudative age-related macular degeneration, right eye, early dry stage: Secondary | ICD-10-CM | POA: Diagnosis not present

## 2017-03-26 DIAGNOSIS — Z0000 Encounter for general adult medical examination without abnormal findings: Secondary | ICD-10-CM | POA: Diagnosis not present

## 2017-03-26 DIAGNOSIS — H409 Unspecified glaucoma: Secondary | ICD-10-CM | POA: Diagnosis not present

## 2017-03-26 DIAGNOSIS — F322 Major depressive disorder, single episode, severe without psychotic features: Secondary | ICD-10-CM | POA: Diagnosis not present

## 2017-03-26 DIAGNOSIS — Z125 Encounter for screening for malignant neoplasm of prostate: Secondary | ICD-10-CM | POA: Diagnosis not present

## 2017-03-26 DIAGNOSIS — E669 Obesity, unspecified: Secondary | ICD-10-CM | POA: Diagnosis not present

## 2017-03-26 DIAGNOSIS — R7303 Prediabetes: Secondary | ICD-10-CM | POA: Diagnosis not present

## 2017-03-26 DIAGNOSIS — H3530 Unspecified macular degeneration: Secondary | ICD-10-CM | POA: Diagnosis not present

## 2017-03-26 DIAGNOSIS — G4733 Obstructive sleep apnea (adult) (pediatric): Secondary | ICD-10-CM | POA: Diagnosis not present

## 2017-03-27 DIAGNOSIS — H524 Presbyopia: Secondary | ICD-10-CM | POA: Diagnosis not present

## 2017-03-28 DIAGNOSIS — Z800 Family history of malignant neoplasm of digestive organs: Secondary | ICD-10-CM | POA: Diagnosis not present

## 2017-03-28 DIAGNOSIS — D126 Benign neoplasm of colon, unspecified: Secondary | ICD-10-CM | POA: Diagnosis not present

## 2017-04-08 DIAGNOSIS — H353221 Exudative age-related macular degeneration, left eye, with active choroidal neovascularization: Secondary | ICD-10-CM | POA: Diagnosis not present

## 2017-04-08 DIAGNOSIS — H35712 Central serous chorioretinopathy, left eye: Secondary | ICD-10-CM | POA: Diagnosis not present

## 2017-04-08 DIAGNOSIS — H35722 Serous detachment of retinal pigment epithelium, left eye: Secondary | ICD-10-CM | POA: Diagnosis not present

## 2017-04-08 DIAGNOSIS — H353111 Nonexudative age-related macular degeneration, right eye, early dry stage: Secondary | ICD-10-CM | POA: Diagnosis not present

## 2017-04-24 ENCOUNTER — Encounter: Payer: Self-pay | Admitting: Dietician

## 2017-04-24 ENCOUNTER — Encounter: Payer: 59 | Attending: Family Medicine | Admitting: Dietician

## 2017-04-24 DIAGNOSIS — E119 Type 2 diabetes mellitus without complications: Secondary | ICD-10-CM

## 2017-04-24 NOTE — Progress Notes (Signed)
Patient was seen on 04/24/17 for the first of a series of three diabetes self-management courses at [facility].  Patient Education Plan per assessed needs and concerns is to attend three course education program for Diabetes Self Management Education.  The following learning objectives were met by the patient during this class:  Describe diabetes  State some common risk factors for diabetes  Defines the role of glucose and insulin  Identifies type of diabetes and pathophysiology  Describe the relationship between diabetes and cardiovascular risk  State the members of the Healthcare Team  States the rationale for glucose monitoring  State when to test glucose  State their individual Target Range  State the importance of logging glucose readings  Describe how to interpret glucose readings  Identifies A1C target  Explain the correlation between A1c and eAG values  State symptoms and treatment of high blood glucose  State symptoms and treatment of low blood glucose  Explain proper technique for glucose testing  Identifies proper sharps disposal  Handouts given during class include:  Living Well with Diabetes book  Carb Counting and Meal Planning book  Meal Plan Card  Meal planning worksheet  Low Sodium Flavoring Tips  Types of Fats  The diabetes portion plate  L5V to eAG Conversion Chart  Diabetes Recommended Care Schedule  Support Group  Diabetes Success Plan  Core Class Satisfaction Survey   Follow-Up Plan:  Attend core 2

## 2017-05-01 ENCOUNTER — Encounter: Payer: 59 | Admitting: Dietician

## 2017-05-01 DIAGNOSIS — E119 Type 2 diabetes mellitus without complications: Secondary | ICD-10-CM

## 2017-05-01 NOTE — Progress Notes (Signed)
Patient was seen on 05/01/17 for the second of a series of three diabetes self-management courses at the [facility]. The following learning objectives were met by the patient during this class:   Describe the role of different macronutrients on glucose  Explain how carbohydrates affect blood glucose  State what foods contain the most carbohydrates  Demonstrate carbohydrate counting  Demonstrate how to read Nutrition Facts food label  Describe effects of various fats on heart health  Describe the importance of good nutrition for health and healthy eating strategies  Describe techniques for managing your shopping, cooking and meal planning  List strategies to follow meal plan when dining out  Describe the effects of alcohol on glucose and how to use it safely  Goals:  Follow Diabetes Meal Plan as instructed  Aim to spread carbs evenly throughout the day  Aim for 3 meals per day and snacks as needed Include lean protein foods to meals/snacks  Monitor glucose levels as instructed by your doctor   Follow-Up Plan:  Attend Core 3  Work towards following your personal food plan.   

## 2017-05-08 ENCOUNTER — Encounter: Payer: 59 | Admitting: Dietician

## 2017-05-08 DIAGNOSIS — E119 Type 2 diabetes mellitus without complications: Secondary | ICD-10-CM

## 2017-05-08 NOTE — Progress Notes (Signed)
Patient was seen on 05/08/17 for the third of a series of three diabetes self-management courses at the [facility].   [place] the amount of activity recommended for healthy living . Describe activities suitable for individual needs . Identify ways to regularly incorporate activity into daily life . Identify barriers to activity and ways to over come these barriers  Identify diabetes medications being personally used and their primary action for lowering glucose and possible side effects . Describe role of stress on blood glucose and develop strategies to address psychosocial issues . Identify diabetes complications and ways to prevent them  Explain how to manage diabetes during illness . Evaluate success in meeting personal goal . Establish 2-3 goals that they will plan to diligently work on  Goals:   I will count my carb choices at most meals and snacks  I will keep a food log - [place] program  I will be active 30 minutes or more 6 times a week  I will test my glucose at least 3 times a day, 7 days a week  Your patient has identified these potential barriers to change:  Motivation  Your patient has identified their diabetes self-care support plan as  Family Support  [place]    Plan:  Attend Support Group as desired

## 2017-05-19 DIAGNOSIS — H35722 Serous detachment of retinal pigment epithelium, left eye: Secondary | ICD-10-CM | POA: Diagnosis not present

## 2017-05-19 DIAGNOSIS — H353221 Exudative age-related macular degeneration, left eye, with active choroidal neovascularization: Secondary | ICD-10-CM | POA: Diagnosis not present

## 2017-05-19 DIAGNOSIS — H353111 Nonexudative age-related macular degeneration, right eye, early dry stage: Secondary | ICD-10-CM | POA: Diagnosis not present

## 2017-05-19 DIAGNOSIS — H35712 Central serous chorioretinopathy, left eye: Secondary | ICD-10-CM | POA: Diagnosis not present

## 2017-06-06 DIAGNOSIS — H35713 Central serous chorioretinopathy, bilateral: Secondary | ICD-10-CM | POA: Diagnosis not present

## 2017-06-06 DIAGNOSIS — H401131 Primary open-angle glaucoma, bilateral, mild stage: Secondary | ICD-10-CM | POA: Diagnosis not present

## 2017-06-06 MED FILL — DORZOLAMIDE-TIMOLOL EYE DRP: 22.3-6.8 | 50 days supply | Qty: 10 | Fill #0

## 2017-06-23 DIAGNOSIS — H353221 Exudative age-related macular degeneration, left eye, with active choroidal neovascularization: Secondary | ICD-10-CM | POA: Diagnosis not present

## 2017-06-23 DIAGNOSIS — H35722 Serous detachment of retinal pigment epithelium, left eye: Secondary | ICD-10-CM | POA: Diagnosis not present

## 2017-06-23 DIAGNOSIS — H353111 Nonexudative age-related macular degeneration, right eye, early dry stage: Secondary | ICD-10-CM | POA: Diagnosis not present

## 2017-06-23 DIAGNOSIS — H35712 Central serous chorioretinopathy, left eye: Secondary | ICD-10-CM | POA: Diagnosis not present

## 2017-07-14 DIAGNOSIS — H401131 Primary open-angle glaucoma, bilateral, mild stage: Secondary | ICD-10-CM | POA: Diagnosis not present

## 2017-07-16 DIAGNOSIS — H35712 Central serous chorioretinopathy, left eye: Secondary | ICD-10-CM | POA: Diagnosis not present

## 2017-07-16 DIAGNOSIS — H353221 Exudative age-related macular degeneration, left eye, with active choroidal neovascularization: Secondary | ICD-10-CM | POA: Diagnosis not present

## 2017-07-16 DIAGNOSIS — H35722 Serous detachment of retinal pigment epithelium, left eye: Secondary | ICD-10-CM | POA: Diagnosis not present

## 2017-07-29 DIAGNOSIS — Z7984 Long term (current) use of oral hypoglycemic drugs: Secondary | ICD-10-CM | POA: Diagnosis not present

## 2017-07-29 DIAGNOSIS — E119 Type 2 diabetes mellitus without complications: Secondary | ICD-10-CM | POA: Diagnosis not present

## 2017-07-29 MED FILL — ACCU-CHEK FASTCLIX LANCETS: 30 days supply | Qty: 102 | Fill #0

## 2017-07-29 MED FILL — ACCU-CHEK GUIDE STRP: 30 days supply | Qty: 100 | Fill #0

## 2017-08-04 MED FILL — DORZOLAMIDE-TIMOLOL EYE DRP: 22.3-6.8 | 50 days supply | Qty: 10 | Fill #1

## 2017-08-07 DIAGNOSIS — H353111 Nonexudative age-related macular degeneration, right eye, early dry stage: Secondary | ICD-10-CM | POA: Diagnosis not present

## 2017-08-07 DIAGNOSIS — H35722 Serous detachment of retinal pigment epithelium, left eye: Secondary | ICD-10-CM | POA: Diagnosis not present

## 2017-08-07 DIAGNOSIS — H353221 Exudative age-related macular degeneration, left eye, with active choroidal neovascularization: Secondary | ICD-10-CM | POA: Diagnosis not present

## 2017-08-07 DIAGNOSIS — H35712 Central serous chorioretinopathy, left eye: Secondary | ICD-10-CM | POA: Diagnosis not present

## 2017-08-15 DIAGNOSIS — H401131 Primary open-angle glaucoma, bilateral, mild stage: Secondary | ICD-10-CM | POA: Diagnosis not present

## 2017-08-15 MED FILL — ROCKLATAN 0.02-0.005 % SOLN: 0.02-0.005 | 25 days supply | Qty: 3 | Fill #0

## 2017-09-22 MED FILL — DORZOLAMIDE-TIMOLOL EYE DRP: 22.3-6.8 | 50 days supply | Qty: 10 | Fill #2

## 2017-09-23 DIAGNOSIS — H35722 Serous detachment of retinal pigment epithelium, left eye: Secondary | ICD-10-CM | POA: Diagnosis not present

## 2017-09-23 DIAGNOSIS — H353221 Exudative age-related macular degeneration, left eye, with active choroidal neovascularization: Secondary | ICD-10-CM | POA: Diagnosis not present

## 2017-09-23 DIAGNOSIS — H353122 Nonexudative age-related macular degeneration, left eye, intermediate dry stage: Secondary | ICD-10-CM | POA: Diagnosis not present

## 2017-09-23 MED FILL — ROCKLATAN 0.02-0.005 % SOLN: 0.02-0.005 | 25 days supply | Qty: 3 | Fill #1

## 2017-10-20 MED FILL — ROCKLATAN 0.02-0.005 % SOLN: 0.02-0.005 | 25 days supply | Qty: 3 | Fill #2

## 2017-10-30 DIAGNOSIS — H35712 Central serous chorioretinopathy, left eye: Secondary | ICD-10-CM | POA: Diagnosis not present

## 2017-10-30 DIAGNOSIS — H35722 Serous detachment of retinal pigment epithelium, left eye: Secondary | ICD-10-CM | POA: Diagnosis not present

## 2017-10-30 DIAGNOSIS — H353221 Exudative age-related macular degeneration, left eye, with active choroidal neovascularization: Secondary | ICD-10-CM | POA: Diagnosis not present

## 2017-10-30 DIAGNOSIS — H353111 Nonexudative age-related macular degeneration, right eye, early dry stage: Secondary | ICD-10-CM | POA: Diagnosis not present

## 2017-11-13 MED FILL — DORZOLAMIDE-TIMOLOL EYE DRP: 22.3-6.8 | 50 days supply | Qty: 10 | Fill #3

## 2017-11-13 MED FILL — ROCKLATAN 0.02-0.005 % SOLN: 0.02-0.005 | 25 days supply | Qty: 3 | Fill #3

## 2017-12-08 DIAGNOSIS — H353111 Nonexudative age-related macular degeneration, right eye, early dry stage: Secondary | ICD-10-CM | POA: Diagnosis not present

## 2017-12-08 DIAGNOSIS — H353221 Exudative age-related macular degeneration, left eye, with active choroidal neovascularization: Secondary | ICD-10-CM | POA: Diagnosis not present

## 2017-12-08 DIAGNOSIS — H35722 Serous detachment of retinal pigment epithelium, left eye: Secondary | ICD-10-CM | POA: Diagnosis not present

## 2017-12-08 DIAGNOSIS — H35712 Central serous chorioretinopathy, left eye: Secondary | ICD-10-CM | POA: Diagnosis not present

## 2017-12-15 MED FILL — ROCKLATAN 0.02-0.005 % SOLN: 0.02-0.005 | 25 days supply | Qty: 3 | Fill #4

## 2017-12-17 MED FILL — ACCU-CHEK GUIDE STRP: 30 days supply | Qty: 100 | Fill #1

## 2018-01-15 DIAGNOSIS — H35722 Serous detachment of retinal pigment epithelium, left eye: Secondary | ICD-10-CM | POA: Diagnosis not present

## 2018-01-15 DIAGNOSIS — H353111 Nonexudative age-related macular degeneration, right eye, early dry stage: Secondary | ICD-10-CM | POA: Diagnosis not present

## 2018-01-15 DIAGNOSIS — H35712 Central serous chorioretinopathy, left eye: Secondary | ICD-10-CM | POA: Diagnosis not present

## 2018-01-15 DIAGNOSIS — H353221 Exudative age-related macular degeneration, left eye, with active choroidal neovascularization: Secondary | ICD-10-CM | POA: Diagnosis not present

## 2018-01-15 MED FILL — DORZOLAMIDE-TIMOLOL EYE DRP: 22.3-6.8 | 50 days supply | Qty: 10 | Fill #4

## 2018-01-15 MED FILL — ROCKLATAN 0.02-0.005 % SOLN: 0.02-0.005 | 25 days supply | Qty: 3 | Fill #5

## 2018-01-30 DIAGNOSIS — E119 Type 2 diabetes mellitus without complications: Secondary | ICD-10-CM | POA: Diagnosis not present

## 2018-01-30 DIAGNOSIS — E785 Hyperlipidemia, unspecified: Secondary | ICD-10-CM | POA: Diagnosis not present

## 2018-01-30 DIAGNOSIS — G4733 Obstructive sleep apnea (adult) (pediatric): Secondary | ICD-10-CM | POA: Diagnosis not present

## 2018-02-06 ENCOUNTER — Other Ambulatory Visit (HOSPITAL_BASED_OUTPATIENT_CLINIC_OR_DEPARTMENT_OTHER): Payer: Self-pay

## 2018-02-06 DIAGNOSIS — G4733 Obstructive sleep apnea (adult) (pediatric): Secondary | ICD-10-CM

## 2018-02-20 DIAGNOSIS — H401131 Primary open-angle glaucoma, bilateral, mild stage: Secondary | ICD-10-CM | POA: Diagnosis not present

## 2018-02-24 MED FILL — ACCU-CHEK GUIDE STRP: 30 days supply | Qty: 100 | Fill #2

## 2018-02-24 MED FILL — ACCU-CHEK FASTCLIX LANCETS: 30 days supply | Qty: 102 | Fill #1

## 2018-02-24 MED FILL — ROCKLATAN 0.02-0.005 % SOLN: 0.02-0.005 | 25 days supply | Qty: 3 | Fill #6

## 2018-02-26 DIAGNOSIS — H35722 Serous detachment of retinal pigment epithelium, left eye: Secondary | ICD-10-CM | POA: Diagnosis not present

## 2018-02-26 DIAGNOSIS — H353221 Exudative age-related macular degeneration, left eye, with active choroidal neovascularization: Secondary | ICD-10-CM | POA: Diagnosis not present

## 2018-02-26 DIAGNOSIS — H353111 Nonexudative age-related macular degeneration, right eye, early dry stage: Secondary | ICD-10-CM | POA: Diagnosis not present

## 2018-02-26 DIAGNOSIS — H35712 Central serous chorioretinopathy, left eye: Secondary | ICD-10-CM | POA: Diagnosis not present

## 2018-02-26 DIAGNOSIS — H353122 Nonexudative age-related macular degeneration, left eye, intermediate dry stage: Secondary | ICD-10-CM | POA: Diagnosis not present

## 2018-02-27 ENCOUNTER — Ambulatory Visit (HOSPITAL_BASED_OUTPATIENT_CLINIC_OR_DEPARTMENT_OTHER): Payer: 59 | Attending: Internal Medicine | Admitting: Internal Medicine

## 2018-02-27 DIAGNOSIS — G4733 Obstructive sleep apnea (adult) (pediatric): Secondary | ICD-10-CM | POA: Insufficient documentation

## 2018-02-28 DIAGNOSIS — G4733 Obstructive sleep apnea (adult) (pediatric): Secondary | ICD-10-CM | POA: Diagnosis not present

## 2018-03-06 NOTE — Procedures (Signed)
    NAME: Nicholas Young DATE OF BIRTH:  1955-06-17 MEDICAL RECORD NUMBER [id number]  LOCATION: [facility]  PHYSICIAN: Marius Ditch  DATE OF STUDY: 02/27/2018  SLEEP STUDY TYPE: Out of Center Sleep Test                REFERRING PHYSICIAN: Marius Ditch, MD  INDICATION FOR STUDY: Re-establish diagnosis of OSA in a patient successfully using CPAP; test needed for insurance purposes as prior studies not available.   EPWORTH SLEEPINESS SCORE:   HEIGHT:    WEIGHT:      There is no height or weight on file to calculate BMI.  NECK SIZE:   in.  MEDICATIONS  Patient self administered medications include: N/A. Medications administered during study include No sleep medicine administered.Marland Kitchen   SLEEP STUDY TECHNIQUE  A multi-channel overnight portable sleep study was performed. The channels recorded were: nasal and oral airflow, thoracic and abdominal respiratory movement, and oxygen saturation with a pulse oximetry. Snoring and body position were also monitored.  TECHNICIAN COMMENTS  Comments added by Technician: N/A Comments added by Scorer: N/A   RECORDING SUMMARY  The study was initiated at 12:21:30 AM and terminated at 7:39:48 AM. The total recorded time was 438.3 minutes.   RESPIRATORY PARAMETERS  The overall AHI was 63.1 per hour, with a central apnea index of 0.0 per hour. The sleep efficiency was 98.8 % and the patient was supine for 8.5%. The arousal index was 0.0 per hour.The oxygen nadir was 82% during sleep.   CARDIAC DATA  Mean heart rate during sleep was 63.3 bpm.  IMPRESSIONS  Severe Obstructive Sleep apnea(OSA)  Moderate Oxygen Desaturation  DIAGNOSIS  Obstructive Sleep Apnea (327.23 [G47.33 ICD-10])  RECOMMENDATIONS  The patient should continue CPAP. This study is valid for new orders for supplies and/or machine.   Marius Ditch Sleep specialist, [place] [employer]  ELECTRONICALLY SIGNED ON:  03/06/2018, 8:37  PM [place] PH: [phone]   FX: [phone] [place]

## 2018-03-06 NOTE — Procedures (Deleted)
    NAME: Nicholas Young DATE OF BIRTH:  1955/04/11 MEDICAL RECORD NUMBER [id number]  LOCATION: [facility]  PHYSICIAN: Marius Ditch  DATE OF STUDY: 02/27/2018  SLEEP STUDY TYPE: Out of Center Sleep Test                REFERRING PHYSICIAN: Marius Ditch, MD  INDICATION FOR STUDY: test done to re-establish diagnosis in patient with OSA on CPAP. Old studies not available and patient needs new supplies and/or machine  EPWORTH SLEEPINESS SCORE:  NA HEIGHT:    WEIGHT:      There is no height or weight on file to calculate BMI.  NECK SIZE:   in.  MEDICATIONS  Patient self administered medications include: N/A. Medications administered during study include No sleep medicine administered.Marland Kitchen   SLEEP STUDY TECHNIQUE  A multi-channel overnight portable sleep study was performed. The channels recorded were: nasal and oral airflow, thoracic and abdominal respiratory movement, and oxygen saturation with a pulse oximetry. Snoring and body position were also monitored.  TECHNICIAN COMMENTS  Comments added by Technician: N/A Comments added by Scorer: N/A   RECORDING SUMMARY  The study was initiated at 11:03:10 PM and terminated at 6:10:22 AM. The total recorded time was 427.2 minutes.   RESPIRATORY PARAMETERS  The overall AHI was 32.9 per hour, with a central apnea index of 0.0 per hour. The sleep efficiency was 100.0 % and the patient was supine for 19.8%. The arousal index was 0.0 per hour.The oxygen nadir was 73% during sleep.   CARDIAC DATA  Mean heart rate during sleep was 59.1 bpm.  IMPRESSIONS  Severe Obstructive Sleep apnea(OSA) Severe Oxygen Desaturation  DIAGNOSIS  Obstructive Sleep Apnea (327.23 [G47.33 ICD-10])  RECOMMENDATIONS  Continue CPAP. This test will be valid for new orders for him.   Marius Ditch Sleep specialist, [place] Board [employer] Medicine  ELECTRONICALLY SIGNED ON:  03/06/2018, 8:25 PM [place] PH: [phone]   FX: [phone] [place]

## 2018-03-19 MED FILL — DORZOLAMIDE-TIMOLOL EYE DRP: 22.3-6.8 | 50 days supply | Qty: 10 | Fill #0 | Status: TO

## 2018-03-19 MED FILL — ROCKLATAN 0.02-0.005 % SOLN: 0.02-0.005 | 25 days supply | Qty: 3 | Fill #0 | Status: TO

## 2018-03-24 DIAGNOSIS — G4733 Obstructive sleep apnea (adult) (pediatric): Secondary | ICD-10-CM | POA: Diagnosis not present

## 2018-04-01 DIAGNOSIS — H5203 Hypermetropia, bilateral: Secondary | ICD-10-CM | POA: Diagnosis not present

## 2018-04-01 DIAGNOSIS — H524 Presbyopia: Secondary | ICD-10-CM | POA: Diagnosis not present

## 2018-04-15 DIAGNOSIS — H353111 Nonexudative age-related macular degeneration, right eye, early dry stage: Secondary | ICD-10-CM | POA: Diagnosis not present

## 2018-04-15 DIAGNOSIS — H35712 Central serous chorioretinopathy, left eye: Secondary | ICD-10-CM | POA: Diagnosis not present

## 2018-04-15 DIAGNOSIS — H35722 Serous detachment of retinal pigment epithelium, left eye: Secondary | ICD-10-CM | POA: Diagnosis not present

## 2018-04-15 DIAGNOSIS — H353221 Exudative age-related macular degeneration, left eye, with active choroidal neovascularization: Secondary | ICD-10-CM | POA: Diagnosis not present

## 2018-04-23 DIAGNOSIS — G4733 Obstructive sleep apnea (adult) (pediatric): Secondary | ICD-10-CM | POA: Diagnosis not present

## 2018-05-11 MED FILL — ROCKLATAN 0.02-0.005 % SOLN: 0.02-0.005 | 25 days supply | Qty: 3 | Fill #0

## 2018-05-11 MED FILL — ACCU-CHEK GUIDE TEST STRIP: 30 days supply | Qty: 100 | Fill #0

## 2018-05-11 MED FILL — DORZOLAMIDE-TIMOLOL EYE DRP: 22.3-6.8 | 50 days supply | Qty: 10 | Fill #0

## 2018-05-11 MED FILL — ACCU-CHEK FASTCLIX LANCETS: 30 days supply | Qty: 102 | Fill #0

## 2018-05-12 DIAGNOSIS — B349 Viral infection, unspecified: Secondary | ICD-10-CM | POA: Diagnosis not present

## 2018-05-15 MED FILL — CIPROFLOXACIN HCL 500 MG TA: 500 | 10 days supply | Qty: 20 | Fill #0

## 2018-05-21 DIAGNOSIS — Z0000 Encounter for general adult medical examination without abnormal findings: Secondary | ICD-10-CM | POA: Diagnosis not present

## 2018-05-21 DIAGNOSIS — Z125 Encounter for screening for malignant neoplasm of prostate: Secondary | ICD-10-CM | POA: Diagnosis not present

## 2018-05-21 DIAGNOSIS — G4733 Obstructive sleep apnea (adult) (pediatric): Secondary | ICD-10-CM | POA: Diagnosis not present

## 2018-05-21 DIAGNOSIS — Z23 Encounter for immunization: Secondary | ICD-10-CM | POA: Diagnosis not present

## 2018-05-21 DIAGNOSIS — F322 Major depressive disorder, single episode, severe without psychotic features: Secondary | ICD-10-CM | POA: Diagnosis not present

## 2018-05-21 DIAGNOSIS — E785 Hyperlipidemia, unspecified: Secondary | ICD-10-CM | POA: Diagnosis not present

## 2018-05-21 DIAGNOSIS — R3911 Hesitancy of micturition: Secondary | ICD-10-CM | POA: Diagnosis not present

## 2018-05-21 DIAGNOSIS — E119 Type 2 diabetes mellitus without complications: Secondary | ICD-10-CM | POA: Diagnosis not present

## 2018-05-21 MED FILL — ROSUVASTATIN CALCIUM 5 MG T: 5 | 90 days supply | Qty: 90 | Fill #0

## 2018-05-21 MED FILL — CITALOPRAM HBR 40 MG TABLET: 40 | 90 days supply | Qty: 90 | Fill #0

## 2018-05-30 MED FILL — ROCKLATAN 0.02-0.005 % SOLN: 0.02-0.005 | 25 days supply | Qty: 3 | Fill #1

## 2018-06-04 MED FILL — ACCU-CHEK FASTCLIX LANCETS: 30 days supply | Qty: 102 | Fill #1

## 2018-06-04 MED FILL — ACCU-CHEK GUIDE TEST STRIP: 90 days supply | Qty: 100 | Fill #0

## 2018-06-09 DIAGNOSIS — H353221 Exudative age-related macular degeneration, left eye, with active choroidal neovascularization: Secondary | ICD-10-CM | POA: Diagnosis not present

## 2018-06-09 DIAGNOSIS — H35712 Central serous chorioretinopathy, left eye: Secondary | ICD-10-CM | POA: Diagnosis not present

## 2018-06-09 DIAGNOSIS — H35722 Serous detachment of retinal pigment epithelium, left eye: Secondary | ICD-10-CM | POA: Diagnosis not present

## 2018-06-09 DIAGNOSIS — H353111 Nonexudative age-related macular degeneration, right eye, early dry stage: Secondary | ICD-10-CM | POA: Diagnosis not present

## 2018-06-24 MED FILL — DORZOLAMIDE-TIMOLOL EYE DRP: 22.3-6.8 | 50 days supply | Qty: 10 | Fill #1

## 2018-07-27 MED FILL — ROCKLATAN 0.02-0.005 % SOLN: 0.02-0.005 | 25 days supply | Qty: 3 | Fill #0

## 2018-07-29 DIAGNOSIS — R972 Elevated prostate specific antigen [PSA]: Secondary | ICD-10-CM | POA: Diagnosis not present

## 2018-07-30 DIAGNOSIS — G4733 Obstructive sleep apnea (adult) (pediatric): Secondary | ICD-10-CM | POA: Diagnosis not present

## 2018-08-03 MED FILL — ROSUVASTATIN CALCIUM 5 MG T: 5 | 90 days supply | Qty: 90 | Fill #0

## 2018-08-03 MED FILL — DORZOLAMIDE-TIMOLOL EYE DRP: 22.3-6.8 | 50 days supply | Qty: 10 | Fill #0

## 2018-08-03 MED FILL — CITALOPRAM HBR 40 MG TABLET: 40 | 90 days supply | Qty: 90 | Fill #0

## 2018-08-18 MED FILL — ACCU-CHEK GUIDE TEST STRIP: 90 days supply | Qty: 100 | Fill #0

## 2018-08-28 DIAGNOSIS — G4733 Obstructive sleep apnea (adult) (pediatric): Secondary | ICD-10-CM | POA: Diagnosis not present

## 2018-09-08 DIAGNOSIS — H353222 Exudative age-related macular degeneration, left eye, with inactive choroidal neovascularization: Secondary | ICD-10-CM | POA: Diagnosis not present

## 2018-09-08 DIAGNOSIS — H35722 Serous detachment of retinal pigment epithelium, left eye: Secondary | ICD-10-CM | POA: Diagnosis not present

## 2018-09-08 DIAGNOSIS — H35712 Central serous chorioretinopathy, left eye: Secondary | ICD-10-CM | POA: Diagnosis not present

## 2018-09-08 DIAGNOSIS — H353111 Nonexudative age-related macular degeneration, right eye, early dry stage: Secondary | ICD-10-CM | POA: Diagnosis not present

## 2018-09-14 MED FILL — ROCKLATAN 0.02-0.005 % SOLN: 0.02-0.005 | 25 days supply | Qty: 3 | Fill #1

## 2018-09-21 DIAGNOSIS — G4733 Obstructive sleep apnea (adult) (pediatric): Secondary | ICD-10-CM | POA: Diagnosis not present

## 2018-10-09 MED FILL — ROCKLATAN 0.02-0.005 % SOLN: 0.02-0.005 | 25 days supply | Qty: 3 | Fill #2

## 2018-10-09 MED FILL — DORZOLAMIDE-TIMOLOL EYE DRP: 22.3-6.8 | 50 days supply | Qty: 10 | Fill #1

## 2018-10-28 DIAGNOSIS — G4733 Obstructive sleep apnea (adult) (pediatric): Secondary | ICD-10-CM | POA: Diagnosis not present

## 2018-11-04 MED FILL — ROCKLATAN 0.02-0.005 % SOLN: 0.02-0.005 | 25 days supply | Qty: 3 | Fill #3

## 2018-11-04 MED FILL — CITALOPRAM HBR 40 MG TABLET: 40 | 90 days supply | Qty: 90 | Fill #1

## 2018-11-20 DIAGNOSIS — H401131 Primary open-angle glaucoma, bilateral, mild stage: Secondary | ICD-10-CM | POA: Diagnosis not present

## 2018-11-20 DIAGNOSIS — E119 Type 2 diabetes mellitus without complications: Secondary | ICD-10-CM | POA: Diagnosis not present

## 2018-11-20 DIAGNOSIS — F322 Major depressive disorder, single episode, severe without psychotic features: Secondary | ICD-10-CM | POA: Diagnosis not present

## 2018-11-20 DIAGNOSIS — E785 Hyperlipidemia, unspecified: Secondary | ICD-10-CM | POA: Diagnosis not present

## 2018-11-20 DIAGNOSIS — G4733 Obstructive sleep apnea (adult) (pediatric): Secondary | ICD-10-CM | POA: Diagnosis not present

## 2018-11-26 DIAGNOSIS — G4733 Obstructive sleep apnea (adult) (pediatric): Secondary | ICD-10-CM | POA: Diagnosis not present

## 2018-12-03 MED FILL — ROCKLATAN 0.02-0.005 % SOLN: 0.02-0.005 | 25 days supply | Qty: 3 | Fill #4

## 2018-12-03 MED FILL — DORZOLAMIDE-TIMOLOL EYE DRP: 22.3-6.8 | 50 days supply | Qty: 10 | Fill #2

## 2019-01-04 MED FILL — ROSUVASTATIN CALCIUM 5 MG T: 5 | 90 days supply | Qty: 90 | Fill #1

## 2019-01-04 MED FILL — ROCKLATAN 0.02-0.005 % SOLN: 0.02-0.005 | 25 days supply | Qty: 3 | Fill #5

## 2019-01-20 DIAGNOSIS — H353222 Exudative age-related macular degeneration, left eye, with inactive choroidal neovascularization: Secondary | ICD-10-CM | POA: Diagnosis not present

## 2019-01-20 DIAGNOSIS — H35722 Serous detachment of retinal pigment epithelium, left eye: Secondary | ICD-10-CM | POA: Diagnosis not present

## 2019-01-20 DIAGNOSIS — H35712 Central serous chorioretinopathy, left eye: Secondary | ICD-10-CM | POA: Diagnosis not present

## 2019-01-20 DIAGNOSIS — H353111 Nonexudative age-related macular degeneration, right eye, early dry stage: Secondary | ICD-10-CM | POA: Diagnosis not present

## 2019-01-26 DIAGNOSIS — G4733 Obstructive sleep apnea (adult) (pediatric): Secondary | ICD-10-CM | POA: Diagnosis not present

## 2019-02-01 MED FILL — CITALOPRAM HBR 40 MG TABLET: 40 | 90 days supply | Qty: 90 | Fill #2

## 2019-02-01 MED FILL — ROCKLATAN 0.02-0.005 % SOLN: 0.02-0.005 | 25 days supply | Qty: 3 | Fill #6

## 2019-02-01 MED FILL — DORZOLAMIDE-TIMOLOL EYE DRP: 22.3-6.8 | 38 days supply | Qty: 10 | Fill #3

## 2019-02-19 DIAGNOSIS — H401131 Primary open-angle glaucoma, bilateral, mild stage: Secondary | ICD-10-CM | POA: Diagnosis not present

## 2019-02-24 DIAGNOSIS — G4733 Obstructive sleep apnea (adult) (pediatric): Secondary | ICD-10-CM | POA: Diagnosis not present

## 2019-03-11 MED FILL — ROCKLATAN 0.02-0.005 % SOLN: 0.02-0.005 | 25 days supply | Qty: 3 | Fill #0

## 2019-03-12 DIAGNOSIS — H401131 Primary open-angle glaucoma, bilateral, mild stage: Secondary | ICD-10-CM | POA: Diagnosis not present

## 2019-03-15 MED FILL — DORZOLAMIDE-TIMOLOL EYE DRP: 22.3-6.8 | 37 days supply | Qty: 10 | Fill #0

## 2019-03-16 MED FILL — FREESTYLE LITE TEST STRIP: 50 days supply | Qty: 50 | Fill #0

## 2019-03-16 MED FILL — FREESTYLE LITE METER: 90 days supply | Qty: 1 | Fill #0

## 2019-03-16 MED FILL — FREESTYLE LANCETS: 90 days supply | Qty: 100 | Fill #0

## 2019-03-22 DIAGNOSIS — G4733 Obstructive sleep apnea (adult) (pediatric): Secondary | ICD-10-CM | POA: Diagnosis not present

## 2019-04-02 DIAGNOSIS — H401131 Primary open-angle glaucoma, bilateral, mild stage: Secondary | ICD-10-CM | POA: Diagnosis not present

## 2019-04-02 DIAGNOSIS — H2513 Age-related nuclear cataract, bilateral: Secondary | ICD-10-CM | POA: Diagnosis not present

## 2019-04-02 DIAGNOSIS — H2512 Age-related nuclear cataract, left eye: Secondary | ICD-10-CM | POA: Diagnosis not present

## 2019-04-20 MED FILL — ROCKLATAN 0.02-0.005 % SOLN: 0.02-0.005 | 25 days supply | Qty: 3 | Fill #1

## 2019-04-20 MED FILL — ROSUVASTATIN CALCIUM 5 MG T: 5 | 90 days supply | Qty: 90 | Fill #2

## 2019-04-23 MED FILL — DUREZOL 0.05% EYE DROPS: 0.05 | 30 days supply | Qty: 5 | Fill #0

## 2019-04-23 MED FILL — BESIVANCE 0.6% SUSP: 0.6 | 30 days supply | Qty: 5 | Fill #0

## 2019-04-23 MED FILL — PROLENSA 0.07% EYE DROPS: 0.07 | 60 days supply | Qty: 3 | Fill #0

## 2019-04-26 DIAGNOSIS — G4733 Obstructive sleep apnea (adult) (pediatric): Secondary | ICD-10-CM | POA: Diagnosis not present

## 2019-04-26 MED FILL — FREESTYLE LITE TEST STRIP: 50 days supply | Qty: 50 | Fill #1

## 2019-05-13 DIAGNOSIS — H2512 Age-related nuclear cataract, left eye: Secondary | ICD-10-CM | POA: Diagnosis not present

## 2019-05-13 DIAGNOSIS — H2513 Age-related nuclear cataract, bilateral: Secondary | ICD-10-CM | POA: Diagnosis not present

## 2019-05-13 DIAGNOSIS — H401121 Primary open-angle glaucoma, left eye, mild stage: Secondary | ICD-10-CM | POA: Diagnosis not present

## 2019-05-14 DIAGNOSIS — H2511 Age-related nuclear cataract, right eye: Secondary | ICD-10-CM | POA: Diagnosis not present

## 2019-05-17 MED FILL — ROCKLATAN 0.02-0.005 % SOLN: 0.02-0.005 | 25 days supply | Qty: 3 | Fill #2

## 2019-05-17 MED FILL — DORZOLAMIDE-TIMOLOL EYE DRP: 22.3-6.8 | 37 days supply | Qty: 10 | Fill #1

## 2019-05-17 MED FILL — BESIVANCE 0.6% SUSP: 0.6 | 30 days supply | Qty: 5 | Fill #1

## 2019-05-17 MED FILL — CITALOPRAM HBR 40 MG TABLET: 40 | 90 days supply | Qty: 90 | Fill #0

## 2019-05-25 DIAGNOSIS — G4733 Obstructive sleep apnea (adult) (pediatric): Secondary | ICD-10-CM | POA: Diagnosis not present

## 2019-05-28 ENCOUNTER — Other Ambulatory Visit (HOSPITAL_COMMUNITY): Payer: Self-pay | Admitting: Family Medicine

## 2019-05-28 DIAGNOSIS — F322 Major depressive disorder, single episode, severe without psychotic features: Secondary | ICD-10-CM | POA: Diagnosis not present

## 2019-05-28 DIAGNOSIS — H409 Unspecified glaucoma: Secondary | ICD-10-CM | POA: Diagnosis not present

## 2019-05-28 DIAGNOSIS — E119 Type 2 diabetes mellitus without complications: Secondary | ICD-10-CM | POA: Diagnosis not present

## 2019-05-28 DIAGNOSIS — G4733 Obstructive sleep apnea (adult) (pediatric): Secondary | ICD-10-CM | POA: Diagnosis not present

## 2019-05-28 DIAGNOSIS — Z125 Encounter for screening for malignant neoplasm of prostate: Secondary | ICD-10-CM | POA: Diagnosis not present

## 2019-05-28 DIAGNOSIS — E785 Hyperlipidemia, unspecified: Secondary | ICD-10-CM | POA: Diagnosis not present

## 2019-05-28 DIAGNOSIS — Z0000 Encounter for general adult medical examination without abnormal findings: Secondary | ICD-10-CM | POA: Diagnosis not present

## 2019-06-03 DIAGNOSIS — H2511 Age-related nuclear cataract, right eye: Secondary | ICD-10-CM | POA: Diagnosis not present

## 2019-06-03 DIAGNOSIS — H401111 Primary open-angle glaucoma, right eye, mild stage: Secondary | ICD-10-CM | POA: Diagnosis not present

## 2019-06-03 DIAGNOSIS — H2513 Age-related nuclear cataract, bilateral: Secondary | ICD-10-CM | POA: Diagnosis not present

## 2019-06-10 MED FILL — PROLENSA 0.07% EYE DROPS: 0.07 | 60 days supply | Qty: 3 | Fill #1

## 2019-06-16 MED FILL — ROCKLATAN 0.02-0.005 % SOLN: 0.02-0.005 | 25 days supply | Qty: 3 | Fill #3

## 2019-07-13 MED FILL — FREESTYLE LANCETS: 90 days supply | Qty: 100 | Fill #1

## 2019-07-13 MED FILL — DORZOLAMIDE-TIMOLOL EYE DRP: 22.3-6.8 | 37 days supply | Qty: 10 | Fill #2

## 2019-07-13 MED FILL — FREESTYLE LITE TEST STRIP: 50 days supply | Qty: 50 | Fill #2

## 2019-07-13 MED FILL — ROCKLATAN 0.02-0.005 % SOLN: 0.02-0.005 | 25 days supply | Qty: 3 | Fill #4

## 2019-07-13 MED FILL — ROSUVASTATIN CALCIUM 5 MG T: 5 | 90 days supply | Qty: 90 | Fill #0

## 2019-07-28 ENCOUNTER — Other Ambulatory Visit: Payer: Self-pay

## 2019-07-28 ENCOUNTER — Ambulatory Visit (INDEPENDENT_AMBULATORY_CARE_PROVIDER_SITE_OTHER): Payer: 59 | Admitting: Ophthalmology

## 2019-07-28 ENCOUNTER — Encounter (INDEPENDENT_AMBULATORY_CARE_PROVIDER_SITE_OTHER): Payer: Self-pay | Admitting: Ophthalmology

## 2019-07-28 DIAGNOSIS — H35722 Serous detachment of retinal pigment epithelium, left eye: Secondary | ICD-10-CM | POA: Diagnosis not present

## 2019-07-28 DIAGNOSIS — H353111 Nonexudative age-related macular degeneration, right eye, early dry stage: Secondary | ICD-10-CM | POA: Diagnosis not present

## 2019-07-28 DIAGNOSIS — H353221 Exudative age-related macular degeneration, left eye, with active choroidal neovascularization: Secondary | ICD-10-CM | POA: Insufficient documentation

## 2019-07-28 DIAGNOSIS — H353222 Exudative age-related macular degeneration, left eye, with inactive choroidal neovascularization: Secondary | ICD-10-CM | POA: Insufficient documentation

## 2019-07-28 NOTE — Progress Notes (Signed)
07/28/2019     CHIEF COMPLAINT Patient presents for Retina Follow Up   HISTORY OF PRESENT ILLNESS: Nicholas Young is a 64 y.o. male who presents to the clinic today for:   HPI    Retina Follow Up    Patient presents with  Dry AMD.  In left eye.  This started 6 months ago.  Severity is mild.  Duration of 6 months.  Since onset it is stable.          Comments    6 Month AMD F/U OS  Pt denies noticeable changes to [place] OU since last visit. Pt denies ocular pain, flashes of light, or floaters OU. Pt sts VA OS has improved since CEIOL OU. Pt is also using Rocklatan QHS OU.        Last edited by Rockie Neighbours, [place] on 07/28/2019  8:31 AM. (History)      Referring physician: Gaynelle Arabian, MD [address],  [place] [address]  HISTORICAL INFORMATION:   Selected notes from the MEDICAL RECORD NUMBER       CURRENT MEDICATIONS: Current Outpatient Medications (Ophthalmic Drugs)  Medication Sig  . Aflibercept (EYLEA IO) Inject into the eye.  . brimonidine-timolol (COMBIGAN) 0.2-0.5 % ophthalmic solution Place 1 drop into both eyes every 12 (twelve) hours.  . dorzolamide-timolol (COSOPT) 22.3-6.8 MG/ML ophthalmic solution 1 drop 2 (two) times daily.   No current facility-administered medications for this visit. (Ophthalmic Drugs)   Current Outpatient Medications (Other)  Medication Sig  . Budesonide (RHINOCORT ALLERGY NA) Place into the nose.  . citalopram (CELEXA) 40 MG tablet Take 40 mg by mouth daily.  Marland Kitchen glucosamine-chondroitin 500-400 MG tablet Take 1 tablet by mouth 3 (three) times daily.  . Multiple Vitamins-Minerals (PRESERVISION AREDS PO) Take by mouth.   No current facility-administered medications for this visit. (Other)      REVIEW OF SYSTEMS:    ALLERGIES No Known Allergies  PAST MEDICAL HISTORY Past Medical History:  Diagnosis Date  . Diabetes mellitus without complication ([place])   . Glaucoma   . Sleep apnea    Past Surgical  History:  Procedure Laterality Date  . ANTERIOR CERVICAL DISCECTOMY    . KNEE ARTHROSCOPY      FAMILY HISTORY History reviewed. No pertinent family history.  SOCIAL HISTORY Social History   Tobacco Use  . Smoking status: Never Smoker  . Smokeless tobacco: Never Used  Substance Use Topics  . Alcohol use: Yes    Comment: 1-2 beers a week  . Drug use: Not on file         OPHTHALMIC EXAM:  Base Eye Exam    Visual Acuity (ETDRS)      Right Left   Dist [place] 20/20 -2 20/25       Tonometry (Tonopen, 8:35 AM)      Right Left   Pressure 09 13       Pupils      Pupils Dark Light Shape React APD   Right PERRL 4 3 Round Brisk None   Left PERRL 4 3 Round Brisk None       Visual Fields (Counting fingers)      Left Right    Full Full       Extraocular Movement      Right Left    Full Full       Neuro/Psych    Oriented x3: Yes   Mood/Affect: Normal       Dilation  Both eyes: 1.0% Mydriacyl, 2.5% Phenylephrine @ 8:35 AM        Slit Lamp and Fundus Exam    Slit Lamp Exam      Right Left   Lids/Lashes Normal Normal   Conjunctiva/Sclera White and quiet White and quiet   Cornea Clear Clear   Anterior Chamber Deep and quiet Deep and quiet   Iris Round and reactive Round and reactive   Lens Posterior chamber intraocular lens Posterior chamber intraocular lens   Anterior Vitreous Normal Normal       Fundus Exam      Right Left   Posterior Vitreous Normal Normal   Disc Normal Normal   C/D Ratio 0.35 0.35   Macula Hard drusen Hard drusen   Vessels Normal Normal   Periphery Normal Normal          IMAGING AND PROCEDURES  Imaging and Procedures for 07/28/19  OCT, Retina - OU - Both Eyes       Right Eye Quality was good. Scan locations included subfoveal. Central Foveal Thickness: 298. Progression has been stable. Findings include retinal drusen .   Left Eye Quality was good. Scan locations included subfoveal. Central Foveal Thickness: 316.  Progression has worsened. Findings include subretinal fluid, intraretinal fluid, retinal drusen .   Notes OS, recurrent subfoveal serous retinal pigment epithelial detachment.  More worrisome however is a region nasal to the foveal avascular zone OS with intraretinal fluid and an outer retinal disruption site, which could be a type I CNVM  OD, no active maculopathy                ASSESSMENT/PLAN:  Exudative age-related macular degeneration of left eye with active choroidal neovascularization ([place]) The nature of wet macular degeneration was discussed with the patient.  Forms of therapy reviewed include the use of Anti-VEGF medications injected painlessly into the eye, as well as other possible treatment modalities, including thermal laser therapy. Fellow eye involvement and risks were discussed with the patient. Upon the finding of wet age related macular degeneration, treatment will be offered. The treatment regimen is on a treat as needed basis with the intent to treat if necessary and extend interval of exams when possible. On average 1 out of 6 patients do not need lifetime therapy. However, the risk of recurrent disease is high for a lifetime.  Initially monthly, then periodic, examinations and evaluations will determine whether the next treatment is required on the day of the examination.  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  Will return in 1 week or 2 depending on patient availability.  Will need fluorescein angiography and intravitreal Avastin left eye      ICD-10-CM   1. Early stage nonexudative age-related macular degeneration of right eye  H35.3111   2. Exudative age-related macular degeneration of left eye with inactive choroidal neovascularization (HCC)  H35.3222 OCT, Retina - OU - Both Eyes  3. Serous detachment of retinal pigment epithelium of left eye  H35.722   4.  Exudative age-related macular degeneration of left eye with active choroidal neovascularization ([place])  H35.3221     1.  Findings reviewed with the patient and he understands.  Fluorescein angiography left, right will be performed followed by the delivery of intravitreal Avastin OS upon his next available time, 2 weeks per patient  2.  3.  Ophthalmic Meds Ordered this visit:  No  orders of the defined types were placed in this encounter.      Return in about 2 weeks (around 08/11/2019) for OPTOS FFA L/R, AVASTIN OCT, OS.  There are no Patient Instructions on file for this visit.   Explained the diagnoses, plan, and follow up with the patient and they expressed understanding.  Patient expressed understanding of the importance of proper follow up care.   Clent Demark Bristal Steffy M.D. Diseases & Surgery of the Retina and Vitreous Retina [facility] Diabetic [place] 07/28/19     Abbreviations: M myopia (nearsighted); A astigmatism; H hyperopia (farsighted); P presbyopia; Mrx spectacle prescription;  CTL contact lenses; OD right eye; OS left eye; OU both eyes  XT exotropia; ET esotropia; PEK punctate epithelial keratitis; PEE punctate epithelial erosions; DES dry eye syndrome; MGD meibomian gland dysfunction; ATs artificial tears; PFAT's preservative free artificial tears; [place] nuclear sclerotic cataract; PSC posterior subcapsular cataract; ERM epi-retinal membrane; PVD posterior vitreous detachment; RD retinal detachment; DM diabetes mellitus; DR diabetic retinopathy; NPDR non-proliferative diabetic retinopathy; PDR proliferative diabetic retinopathy; CSME clinically significant macular edema; DME diabetic macular edema; dbh dot blot hemorrhages; CWS cotton wool spot; POAG primary open angle glaucoma; C/D cup-to-disc ratio; HVF humphrey visual field; GVF goldmann visual field; OCT optical coherence tomography; IOP intraocular pressure; BRVO Branch retinal vein occlusion; CRVO central retinal vein occlusion;  CRAO central retinal artery occlusion; BRAO branch retinal artery occlusion; RT retinal tear; SB scleral buckle; PPV pars plana vitrectomy; VH Vitreous hemorrhage; PRP panretinal laser photocoagulation; IVK intravitreal kenalog; VMT vitreomacular traction; MH Macular hole;  NVD neovascularization of the disc; NVE neovascularization elsewhere; AREDS age related eye disease study; ARMD age related macular degeneration; POAG primary open angle glaucoma; EBMD epithelial/anterior basement membrane dystrophy; ACIOL anterior chamber intraocular lens; IOL intraocular lens; PCIOL posterior chamber intraocular lens; Phaco/IOL phacoemulsification with intraocular lens placement; [place] photorefractive keratectomy; LASIK laser assisted in situ keratomileusis; HTN hypertension; DM diabetes mellitus; COPD chronic obstructive pulmonary disease

## 2019-07-28 NOTE — Assessment & Plan Note (Signed)
The nature of wet macular degeneration was discussed with the patient.  Forms of therapy reviewed include the use of Anti-VEGF medications injected painlessly into the eye, as well as other possible treatment modalities, including thermal laser therapy. Fellow eye involvement and risks were discussed with the patient. Upon the finding of wet age related macular degeneration, treatment will be offered. The treatment regimen is on a treat as needed basis with the intent to treat if necessary and extend interval of exams when possible. On average 1 out of 6 patients do not need lifetime therapy. However, the risk of recurrent disease is high for a lifetime.  Initially monthly, then periodic, examinations and evaluations will determine whether the next treatment is required on the day of the examination.  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  Will return in 1 week or 2 depending on patient availability.  Will need fluorescein angiography and intravitreal Avastin left eye

## 2019-08-09 ENCOUNTER — Ambulatory Visit (INDEPENDENT_AMBULATORY_CARE_PROVIDER_SITE_OTHER): Payer: 59 | Admitting: Ophthalmology

## 2019-08-09 ENCOUNTER — Encounter (INDEPENDENT_AMBULATORY_CARE_PROVIDER_SITE_OTHER): Payer: Self-pay | Admitting: Ophthalmology

## 2019-08-09 ENCOUNTER — Other Ambulatory Visit: Payer: Self-pay

## 2019-08-09 DIAGNOSIS — H353221 Exudative age-related macular degeneration, left eye, with active choroidal neovascularization: Secondary | ICD-10-CM

## 2019-08-09 MED ORDER — FLUORESCEIN SODIUM 10 % IV SOLN
500.0000 mg | INTRAVENOUS | Status: AC | PRN
  Administered 2019-08-09: 500 mg via INTRAVENOUS

## 2019-08-09 MED ORDER — BEVACIZUMAB CHEMO INJECTION 1.25MG/0.05ML SYRINGE FOR KALEIDOSCOPE
1.2500 mg | INTRAVITREAL | Status: AC | PRN
  Administered 2019-08-09: 1.25 mg via INTRAVITREAL

## 2019-08-09 NOTE — Progress Notes (Signed)
08/09/2019     CHIEF COMPLAINT Patient presents for Retina Follow Up   HISTORY OF PRESENT ILLNESS: Nicholas Young is a 64 y.o. male who presents to the clinic today for:   HPI    Retina Follow Up    Patient presents with  Wet AMD.  In left eye.  Duration of 2 weeks.  Since onset it is stable.          Comments    2 week follow up - OCT OU, FFA/FP L/R, Poss Avastin OS Patient denies change in vision and overall has no complaints.        Last edited by Gerda Diss on 08/09/2019 10:50 AM. (History)      Referring physician: Gaynelle Arabian, MD [address],  [place] [address]  HISTORICAL INFORMATION:   Selected notes from the MEDICAL RECORD NUMBER       CURRENT MEDICATIONS: Current Outpatient Medications (Ophthalmic Drugs)  Medication Sig  . Aflibercept (EYLEA IO) Inject into the eye.  . brimonidine-timolol (COMBIGAN) 0.2-0.5 % ophthalmic solution Place 1 drop into both eyes every 12 (twelve) hours.  . dorzolamide-timolol (COSOPT) 22.3-6.8 MG/ML ophthalmic solution 1 drop 2 (two) times daily.   No current facility-administered medications for this visit. (Ophthalmic Drugs)   Current Outpatient Medications (Other)  Medication Sig  . Budesonide (RHINOCORT ALLERGY NA) Place into the nose.  . citalopram (CELEXA) 40 MG tablet Take 40 mg by mouth daily.  Marland Kitchen glucosamine-chondroitin 500-400 MG tablet Take 1 tablet by mouth 3 (three) times daily.  . Multiple Vitamins-Minerals (PRESERVISION AREDS PO) Take by mouth.   No current facility-administered medications for this visit. (Other)      REVIEW OF SYSTEMS:    ALLERGIES No Known Allergies  PAST MEDICAL HISTORY Past Medical History:  Diagnosis Date  . Diabetes mellitus without complication ([place])   . Glaucoma   . Sleep apnea    Past Surgical History:  Procedure Laterality Date  . ANTERIOR CERVICAL DISCECTOMY    . KNEE ARTHROSCOPY      FAMILY HISTORY History reviewed. No pertinent  family history.  SOCIAL HISTORY Social History   Tobacco Use  . Smoking status: Never Smoker  . Smokeless tobacco: Never Used  Substance Use Topics  . Alcohol use: Yes    Comment: 1-2 beers a week  . Drug use: Not on file         OPHTHALMIC EXAM:  Base Eye Exam    Visual Acuity (Snellen - Linear)      Right Left   Dist [place] 20/20-2 20/30-1   Dist ph [place]  20/25-1       Tonometry (Tonopen, 10:59 AM)      Right Left   Pressure 4 6       Pupils      Pupils Dark Light Shape React APD   Right PERRL 4 3 Round Brisk None   Left PERRL 4 3 Round Brisk None       Visual Fields (Counting fingers)      Left Right    Full Full       Extraocular Movement      Right Left    Full Full       Neuro/Psych    Oriented x3: Yes   Mood/Affect: Normal       Dilation    Both eyes: 1.0% Mydriacyl, 2.5% Phenylephrine @ 10:59 AM        Slit Lamp and Fundus Exam  External Exam      Right Left   External Normal Normal       Slit Lamp Exam      Right Left   Lids/Lashes Normal Normal   Conjunctiva/Sclera White and quiet White and quiet   Cornea Clear Clear   Anterior Chamber Deep and quiet Deep and quiet   Iris Round and reactive Round and reactive   Lens Posterior chamber intraocular lens Posterior chamber intraocular lens   Anterior Vitreous Normal Normal       Fundus Exam      Right Left   Posterior Vitreous Normal Posterior vitreous detachment   Disc Normal Normal   C/D Ratio 0.05 0.2   Macula Hard drusen Hard drusen   Vessels Normal Normal   Periphery Normal Normal          IMAGING AND PROCEDURES  Imaging and Procedures for 08/09/19  OCT, Retina - OU - Both Eyes       Right Eye Quality was good. Scan locations included subfoveal. Central Foveal Thickness: 297. Progression has been stable. Findings include retinal drusen .   Left Eye Quality was good. Scan locations included subfoveal. Central Foveal Thickness: 317. Progression has been stable.  Findings include subretinal fluid, intraretinal fluid, choroidal neovascular membrane.        Fluorescein Angiography Optos (Transit OS)       Injection:  500 mg Fluorescein Sodium 10 % injection   NDC: [phone]   Route: IntravenousRight Eye   Progression has improved. Early phase findings include normal observations. Mid/Late phase findings include normal observations. Choroidal neovascularization is not present.   Left Eye   Progression has improved. Early phase findings include leakage, window defect, staining. Mid/Late phase findings include leakage. Choroidal neovascularization is juxtafoveal.   Notes OS with new leakage superior to the fovea with intraretinal fluid confirmed with microvascular disease which could be an early RAP lesion.       Color Fundus Photography Optos - OU - Both Eyes       Right Eye Progression has improved. Disc findings include normal observations. Macula : normal observations. Vessels : normal observations. Periphery : normal observations.   Left Eye Progression has been stable. Disc findings include normal observations. Macula : drusen. Vessels : normal observations. Periphery : normal observations.        Intravitreal Injection, Pharmacologic Agent - OS - Left Eye       Time Out 08/09/2019. 11:56 AM. Confirmed correct patient, procedure, site, and patient consented.   Anesthesia Topical anesthesia was used. Anesthetic medications included Akten 3.5%.   Procedure Preparation included 10% betadine to eyelids, Ofloxacin . A 30 gauge needle was used.   Injection:  1.25 mg Bevacizumab (AVASTIN) SOLN   NDC: [address]-6, Lot: [id number]   Route: Intravitreal, Site: Left Eye, Waste: 0 mg  Post-op Post injection exam found visual acuity of at least counting fingers. The patient tolerated the procedure well. There were no complications. The patient received written and verbal post procedure care education. Post injection medications were  not given.                 ASSESSMENT/PLAN:  No problem-specific Assessment & Plan notes found for this encounter.      ICD-10-CM   1. Exudative age-related macular degeneration of left eye with active choroidal neovascularization (HCC)  H35.3221 OCT, Retina - OU - Both Eyes    Fluorescein Angiography Optos (Transit OS)    Fluorescein Sodium 10 % injection 500 mg  Color Fundus Photography Optos - OU - Both Eyes    Intravitreal Injection, Pharmacologic Agent - OS - Left Eye    Bevacizumab (AVASTIN) SOLN 1.25 mg    CANCELED: Color Fundus Photography Optos - OS - Left Eye    1.  2.  3.  Ophthalmic Meds Ordered this visit:  Meds ordered this encounter  Medications  . Fluorescein Sodium 10 % injection 500 mg  . Bevacizumab (AVASTIN) SOLN 1.25 mg       No follow-ups on file.  There are no Patient Instructions on file for this visit.   Explained the diagnoses, plan, and follow up with the patient and they expressed understanding.  Patient expressed understanding of the importance of proper follow up care.   Clent Demark Juanluis Guastella M.D. Diseases & Surgery of the Retina and Vitreous Retina & Diabetic [place] 08/09/19     Abbreviations: M myopia (nearsighted); A astigmatism; H hyperopia (farsighted); P presbyopia; Mrx spectacle prescription;  CTL contact lenses; OD right eye; OS left eye; OU both eyes  XT exotropia; ET esotropia; PEK punctate epithelial keratitis; PEE punctate epithelial erosions; DES dry eye syndrome; MGD meibomian gland dysfunction; ATs artificial tears; PFAT's preservative free artificial tears; [place] nuclear sclerotic cataract; PSC posterior subcapsular cataract; ERM epi-retinal membrane; PVD posterior vitreous detachment; RD retinal detachment; DM diabetes mellitus; DR diabetic retinopathy; NPDR non-proliferative diabetic retinopathy; PDR proliferative diabetic retinopathy; CSME clinically significant macular edema; DME diabetic macular edema; dbh dot  blot hemorrhages; CWS cotton wool spot; POAG primary open angle glaucoma; C/D cup-to-disc ratio; HVF humphrey visual field; GVF goldmann visual field; OCT optical coherence tomography; IOP intraocular pressure; BRVO Branch retinal vein occlusion; CRVO central retinal vein occlusion; CRAO central retinal artery occlusion; BRAO branch retinal artery occlusion; RT retinal tear; SB scleral buckle; PPV pars plana vitrectomy; VH Vitreous hemorrhage; PRP panretinal laser photocoagulation; IVK intravitreal kenalog; VMT vitreomacular traction; MH Macular hole;  NVD neovascularization of the disc; NVE neovascularization elsewhere; AREDS age related eye disease study; ARMD age related macular degeneration; POAG primary open angle glaucoma; EBMD epithelial/anterior basement membrane dystrophy; ACIOL anterior chamber intraocular lens; IOL intraocular lens; PCIOL posterior chamber intraocular lens; Phaco/IOL phacoemulsification with intraocular lens placement; [place] photorefractive keratectomy; LASIK laser assisted in situ keratomileusis; HTN hypertension; DM diabetes mellitus; COPD chronic obstructive pulmonary disease

## 2019-08-11 MED FILL — ROCKLATAN 0.02-0.005 % SOLN: 0.02-0.005 | 25 days supply | Qty: 3 | Fill #5

## 2019-08-11 MED FILL — CITALOPRAM HBR 40 MG TABLET: 40 | 90 days supply | Qty: 90 | Fill #0

## 2019-08-12 ENCOUNTER — Encounter (INDEPENDENT_AMBULATORY_CARE_PROVIDER_SITE_OTHER): Payer: 59 | Admitting: Ophthalmology

## 2019-08-23 DIAGNOSIS — G4733 Obstructive sleep apnea (adult) (pediatric): Secondary | ICD-10-CM | POA: Diagnosis not present

## 2019-09-06 DIAGNOSIS — G4733 Obstructive sleep apnea (adult) (pediatric): Secondary | ICD-10-CM | POA: Diagnosis not present

## 2019-09-06 MED FILL — DORZOLAMIDE-TIMOLOL EYE DRP: 22.3-6.8 | 37 days supply | Qty: 10 | Fill #3

## 2019-09-06 MED FILL — FREESTYLE LITE TEST STRIP: 50 days supply | Qty: 50 | Fill #3

## 2019-09-10 ENCOUNTER — Other Ambulatory Visit (HOSPITAL_COMMUNITY): Payer: Self-pay | Admitting: Ophthalmology

## 2019-09-10 DIAGNOSIS — H401111 Primary open-angle glaucoma, right eye, mild stage: Secondary | ICD-10-CM | POA: Diagnosis not present

## 2019-09-10 MED FILL — ROCKLATAN 0.02-0.005 % SOLN: 0.02-0.005 | 25 days supply | Qty: 3 | Fill #0

## 2019-09-16 ENCOUNTER — Other Ambulatory Visit: Payer: Self-pay

## 2019-09-16 ENCOUNTER — Encounter (INDEPENDENT_AMBULATORY_CARE_PROVIDER_SITE_OTHER): Payer: Self-pay | Admitting: Ophthalmology

## 2019-09-16 ENCOUNTER — Ambulatory Visit (INDEPENDENT_AMBULATORY_CARE_PROVIDER_SITE_OTHER): Payer: 59 | Admitting: Ophthalmology

## 2019-09-16 DIAGNOSIS — H353221 Exudative age-related macular degeneration, left eye, with active choroidal neovascularization: Secondary | ICD-10-CM | POA: Diagnosis not present

## 2019-09-16 MED ORDER — BEVACIZUMAB CHEMO INJECTION 1.25MG/0.05ML SYRINGE FOR KALEIDOSCOPE
1.2500 mg | INTRAVITREAL | Status: AC | PRN
  Administered 2019-09-16: 1.25 mg via INTRAVITREAL

## 2019-09-16 NOTE — Progress Notes (Signed)
09/16/2019     CHIEF COMPLAINT Patient presents for Retina Follow Up   HISTORY OF PRESENT ILLNESS: Nicholas Young is a 64 y.o. male who presents to the clinic today for:   HPI    Retina Follow Up    Patient presents with  Wet AMD.  In left eye.  Severity is moderate.  Duration of 5 weeks.  Since onset it is stable.  I, the attending physician,  performed the HPI with the patient and updated documentation appropriately.          Comments    5 Week Wet AMD f\u OS. Possible Avastin OS. OCT  Pt states vision is stable. Denies any changes.       Last edited by Tilda Franco on 09/16/2019  8:39 AM. (History)      Referring physician: Gaynelle Arabian, MD [address],  [place] [address]  HISTORICAL INFORMATION:   Selected notes from the MEDICAL RECORD NUMBER       CURRENT MEDICATIONS: Current Outpatient Medications (Ophthalmic Drugs)  Medication Sig  . Aflibercept (EYLEA IO) Inject into the eye.  . brimonidine-timolol (COMBIGAN) 0.2-0.5 % ophthalmic solution Place 1 drop into both eyes every 12 (twelve) hours.  . dorzolamide-timolol (COSOPT) 22.3-6.8 MG/ML ophthalmic solution 1 drop 2 (two) times daily.   No current facility-administered medications for this visit. (Ophthalmic Drugs)   Current Outpatient Medications (Other)  Medication Sig  . Budesonide (RHINOCORT ALLERGY NA) Place into the nose.  . citalopram (CELEXA) 40 MG tablet Take 40 mg by mouth daily.  Marland Kitchen glucosamine-chondroitin 500-400 MG tablet Take 1 tablet by mouth 3 (three) times daily.  . Multiple Vitamins-Minerals (PRESERVISION AREDS PO) Take by mouth.   No current facility-administered medications for this visit. (Other)      REVIEW OF SYSTEMS:    ALLERGIES No Known Allergies  PAST MEDICAL HISTORY Past Medical History:  Diagnosis Date  . Diabetes mellitus without complication ([place])   . Glaucoma   . Sleep apnea    Past Surgical History:  Procedure Laterality Date    . ANTERIOR CERVICAL DISCECTOMY    . KNEE ARTHROSCOPY      FAMILY HISTORY History reviewed. No pertinent family history.  SOCIAL HISTORY Social History   Tobacco Use  . Smoking status: Never Smoker  . Smokeless tobacco: Never Used  Substance Use Topics  . Alcohol use: Yes    Comment: 1-2 beers a week  . Drug use: Not on file         OPHTHALMIC EXAM:  Base Eye Exam    Visual Acuity (Snellen - Linear)      Right Left   Dist [place] 20/20 -2 20/25 -1       Tonometry (Tonopen, 8:43 AM)      Right Left   Pressure 4 6       Pupils      Pupils Dark Light Shape React APD   Right PERRL 4 3 Round Brisk None   Left PERRL 4 3 Round Brisk None       Visual Fields (Counting fingers)      Left Right    Full Full       Neuro/Psych    Oriented x3: Yes   Mood/Affect: Normal       Dilation    Left eye: 1.0% Mydriacyl, 2.5% Phenylephrine @ 8:43 AM        Slit Lamp and Fundus Exam    External Exam  Right Left   External Normal Normal       Slit Lamp Exam      Right Left   Lids/Lashes Normal Normal   Conjunctiva/Sclera White and quiet White and quiet   Cornea Clear Clear   Anterior Chamber Deep and quiet Deep and quiet   Iris Round and reactive Round and reactive   Lens Posterior chamber intraocular lens Posterior chamber intraocular lens   Anterior Vitreous Normal Normal       Fundus Exam      Right Left   Posterior Vitreous  Posterior vitreous detachment   Disc  Normal   C/D Ratio  0.25   Macula  Hard drusen, Retinal pigment epithelial mottling, Pigmented atrophy   Vessels  Normal   Periphery  Normal          IMAGING AND PROCEDURES  Imaging and Procedures for 09/16/19  OCT, Retina - OU - Both Eyes       Right Eye Scan locations included subfoveal. Central Foveal Thickness: 296. Progression has been stable.   Left Eye Quality was good. Scan locations included subfoveal. Central Foveal Thickness: 317. Progression has improved. Findings  include no SRF, abnormal foveal contour, pigment epithelial detachment, intraretinal fluid.   Notes OS, persistent and recurrent intraretinal fluid superonasal to the foveal region, although somewhat improved after most recent injection Avastin Will repeat  Today on examination some 5 weeks       Intravitreal Injection, Pharmacologic Agent - OS - Left Eye       Time Out 09/16/2019. 9:47 AM. Confirmed correct patient, procedure, site, and patient consented.   Anesthesia Topical anesthesia was used. Anesthetic medications included Akten 3.5%.   Procedure Preparation included 10% betadine to eyelids, 5% betadine to ocular surface. A 30 gauge needle was used.   Injection:  1.25 mg Bevacizumab (AVASTIN) SOLN   NDC: [address]-3, Lot: [id number]   Route: Intravitreal, Site: Left Eye, Waste: 0 mg  Post-op Post injection exam found visual acuity of at least counting fingers. The patient tolerated the procedure well. There were no complications. The patient received written and verbal post procedure care education. Post injection medications were not given.                 ASSESSMENT/PLAN:  Exudative age-related macular degeneration of left eye with active choroidal neovascularization (HCC) Improved intraretinal fluid component of CNVM, superonasal to the fovea left eye.  Will repeat today intravitreal Avastin examination in 5 weeks      ICD-10-CM   1. Exudative age-related macular degeneration of left eye with active choroidal neovascularization (HCC)  H35.3221 OCT, Retina - OU - Both Eyes    Intravitreal Injection, Pharmacologic Agent - OS - Left Eye    Bevacizumab (AVASTIN) SOLN 1.25 mg    1.  2.  3.  Ophthalmic Meds Ordered this visit:  Meds ordered this encounter  Medications  . Bevacizumab (AVASTIN) SOLN 1.25 mg       Return in about 5 weeks (around 10/21/2019) for dilate, OS, AVASTIN OCT.  There are no Patient Instructions on file for this visit.   Explained  the diagnoses, plan, and follow up with the patient and they expressed understanding.  Patient expressed understanding of the importance of proper follow up care.   Clent Demark Kimberlie Csaszar M.D. Diseases & Surgery of the Retina and Vitreous Retina & Diabetic [place] 09/16/19     Abbreviations: M myopia (nearsighted); A astigmatism; H hyperopia (farsighted); P presbyopia; Mrx spectacle prescription;  CTL  contact lenses; OD right eye; OS left eye; OU both eyes  XT exotropia; ET esotropia; PEK punctate epithelial keratitis; PEE punctate epithelial erosions; DES dry eye syndrome; MGD meibomian gland dysfunction; ATs artificial tears; PFAT's preservative free artificial tears; [place] nuclear sclerotic cataract; PSC posterior subcapsular cataract; ERM epi-retinal membrane; PVD posterior vitreous detachment; RD retinal detachment; DM diabetes mellitus; DR diabetic retinopathy; NPDR non-proliferative diabetic retinopathy; PDR proliferative diabetic retinopathy; CSME clinically significant macular edema; DME diabetic macular edema; dbh dot blot hemorrhages; CWS cotton wool spot; POAG primary open angle glaucoma; C/D cup-to-disc ratio; HVF humphrey visual field; GVF goldmann visual field; OCT optical coherence tomography; IOP intraocular pressure; BRVO Branch retinal vein occlusion; CRVO central retinal vein occlusion; CRAO central retinal artery occlusion; BRAO branch retinal artery occlusion; RT retinal tear; SB scleral buckle; PPV pars plana vitrectomy; VH Vitreous hemorrhage; PRP panretinal laser photocoagulation; IVK intravitreal kenalog; VMT vitreomacular traction; MH Macular hole;  NVD neovascularization of the disc; NVE neovascularization elsewhere; AREDS age related eye disease study; ARMD age related macular degeneration; POAG primary open angle glaucoma; EBMD epithelial/anterior basement membrane dystrophy; ACIOL anterior chamber intraocular lens; IOL intraocular lens; PCIOL posterior chamber intraocular lens;  Phaco/IOL phacoemulsification with intraocular lens placement; [place] photorefractive keratectomy; LASIK laser assisted in situ keratomileusis; HTN hypertension; DM diabetes mellitus; COPD chronic obstructive pulmonary disease

## 2019-09-16 NOTE — Assessment & Plan Note (Signed)
Improved intraretinal fluid component of CNVM, superonasal to the fovea left eye.  Will repeat today intravitreal Avastin examination in 5 weeks

## 2019-09-20 DIAGNOSIS — G4733 Obstructive sleep apnea (adult) (pediatric): Secondary | ICD-10-CM | POA: Diagnosis not present

## 2019-10-07 DIAGNOSIS — G4733 Obstructive sleep apnea (adult) (pediatric): Secondary | ICD-10-CM | POA: Diagnosis not present

## 2019-10-19 ENCOUNTER — Other Ambulatory Visit: Payer: Self-pay

## 2019-10-19 ENCOUNTER — Ambulatory Visit (INDEPENDENT_AMBULATORY_CARE_PROVIDER_SITE_OTHER): Payer: 59 | Admitting: Ophthalmology

## 2019-10-19 ENCOUNTER — Encounter (INDEPENDENT_AMBULATORY_CARE_PROVIDER_SITE_OTHER): Payer: Self-pay | Admitting: Ophthalmology

## 2019-10-19 DIAGNOSIS — H353221 Exudative age-related macular degeneration, left eye, with active choroidal neovascularization: Secondary | ICD-10-CM | POA: Diagnosis not present

## 2019-10-19 MED ORDER — AFLIBERCEPT 2MG/0.05ML IZ SOLN FOR KALEIDOSCOPE
2.0000 mg | INTRAVITREAL | Status: AC | PRN
  Administered 2019-10-19: 2 mg via INTRAVITREAL

## 2019-10-19 MED FILL — ROSUVASTATIN CALCIUM 5 MG T: 5 | 90 days supply | Qty: 90 | Fill #1

## 2019-10-19 MED FILL — DORZOLAMIDE-TIMOLOL EYE DRP: 22.3-6.8 | 37 days supply | Qty: 10 | Fill #4

## 2019-10-19 MED FILL — ROCKLATAN 0.02-0.005 % SOLN: 0.02-0.005 | 25 days supply | Qty: 3 | Fill #1

## 2019-10-19 NOTE — Patient Instructions (Signed)
Patient to report promptly any new onset visual acuity decline or distortion

## 2019-10-19 NOTE — Progress Notes (Signed)
10/19/2019     CHIEF COMPLAINT Patient presents for Retina Follow Up   HISTORY OF PRESENT ILLNESS: Nicholas Young is a 64 y.o. male who presents to the clinic today for:   HPI    Retina Follow Up    Patient presents with  Wet AMD.  In left eye.  Severity is moderate.  Duration of 4.5 weeks.  Since onset it is stable.  I, the attending physician,  performed the HPI with the patient and updated documentation appropriately.          Comments    4.5 Week Wet AMD f\u OS. Possible Avastin OS. OCT  Pt states vision is about the same. Denies any complaints.       Last edited by Tilda Franco on 10/19/2019  3:09 PM. (History)      Referring physician: Gaynelle Arabian, MD [address],  [place] [address]  HISTORICAL INFORMATION:   Selected notes from the MEDICAL RECORD NUMBER       CURRENT MEDICATIONS: Current Outpatient Medications (Ophthalmic Drugs)  Medication Sig  . Aflibercept (EYLEA IO) Inject into the eye.  . brimonidine-timolol (COMBIGAN) 0.2-0.5 % ophthalmic solution Place 1 drop into both eyes every 12 (twelve) hours.  . dorzolamide-timolol (COSOPT) 22.3-6.8 MG/ML ophthalmic solution 1 drop 2 (two) times daily.   No current facility-administered medications for this visit. (Ophthalmic Drugs)   Current Outpatient Medications (Other)  Medication Sig  . Budesonide (RHINOCORT ALLERGY NA) Place into the nose.  . citalopram (CELEXA) 40 MG tablet Take 40 mg by mouth daily.  Marland Kitchen glucosamine-chondroitin 500-400 MG tablet Take 1 tablet by mouth 3 (three) times daily.  . Multiple Vitamins-Minerals (PRESERVISION AREDS PO) Take by mouth.   No current facility-administered medications for this visit. (Other)      REVIEW OF SYSTEMS:    ALLERGIES No Known Allergies  PAST MEDICAL HISTORY Past Medical History:  Diagnosis Date  . Diabetes mellitus without complication ([place])   . Glaucoma   . Sleep apnea    Past Surgical History:  Procedure  Laterality Date  . ANTERIOR CERVICAL DISCECTOMY    . KNEE ARTHROSCOPY      FAMILY HISTORY History reviewed. No pertinent family history.  SOCIAL HISTORY Social History   Tobacco Use  . Smoking status: Never Smoker  . Smokeless tobacco: Never Used  Substance Use Topics  . Alcohol use: Yes    Comment: 1-2 beers a week  . Drug use: Not on file         OPHTHALMIC EXAM:  Base Eye Exam    Visual Acuity (Snellen - Linear)      Right Left   Dist [place] 20/20 -1 20/30 +   Dist ph [place]  20/20 -1       Tonometry (Tonopen, 3:12 PM)      Right Left   Pressure 8 13       Pupils      Pupils Dark Light Shape React APD   Right PERRL 4 3 Round Brisk None   Left PERRL 4 3 Round Brisk None       Visual Fields (Counting fingers)      Left Right    Full Full       Neuro/Psych    Oriented x3: Yes   Mood/Affect: Normal       Dilation    Left eye: 1.0% Mydriacyl, 2.5% Phenylephrine @ 3:12 PM        Slit Lamp and  Fundus Exam    External Exam      Right Left   External Normal Normal       Slit Lamp Exam      Right Left   Lids/Lashes Normal Normal   Conjunctiva/Sclera White and quiet White and quiet   Cornea Clear Clear   Anterior Chamber Deep and quiet Deep and quiet   Iris Round and reactive Round and reactive   Lens Posterior chamber intraocular lens Posterior chamber intraocular lens   Anterior Vitreous Normal Normal       Fundus Exam      Right Left   Posterior Vitreous  Posterior vitreous detachment   Disc  Normal   C/D Ratio  0.25   Macula  Hard drusen, Retinal pigment epithelial mottling, Pigmented atrophy   Vessels  Normal   Periphery  Normal          IMAGING AND PROCEDURES  Imaging and Procedures for 10/19/19  OCT, Retina - OU - Both Eyes       Right Eye Scan locations included subfoveal. Central Foveal Thickness: 296. Progression has been stable.   Left Eye Quality was good. Scan locations included subfoveal. Central Foveal Thickness: 315.  Progression has improved. Findings include no SRF, abnormal foveal contour, pigment epithelial detachment, intraretinal fluid, subretinal fluid.   Notes OS, persistent and recurrent intraretinal fluid superonasal to the foveal region, although somewhat improved after most recent injection Avastin Will repeat  Today on examination some 5 weeks. Subretinal fluid subfoveal persistent OS likely as a consequence of recurrent central serous retinopathy, as it is distant from the intraretinal fluid component of the CNVM network       Intravitreal Injection, Pharmacologic Agent - OS - Left Eye       Time Out 10/19/2019. 3:30 PM. Confirmed correct patient, procedure, site, and patient consented.   Anesthesia Topical anesthesia was used. Anesthetic medications included Akten 3.5%.   Procedure Preparation included 5% betadine to ocular surface, 10% betadine to eyelids, Tobramycin 0.3%, Ofloxacin . A 30 gauge needle was used.   Injection:  2 mg aflibercept Alfonse Flavors) SOLN   NDC: A3590391, Lot: [id number]   Route: Intravitreal, Site: Left Eye, Waste: 0 mg  Post-op Post injection exam found visual acuity of at least counting fingers. The patient tolerated the procedure well. There were no complications. The patient received written and verbal post procedure care education. Post injection medications were not given.                 ASSESSMENT/PLAN:  No problem-specific Assessment & Plan notes found for this encounter.      ICD-10-CM   1. Exudative age-related macular degeneration of left eye with active choroidal neovascularization (HCC)  H35.3221 OCT, Retina - OU - Both Eyes    Intravitreal Injection, Pharmacologic Agent - OS - Left Eye    aflibercept (EYLEA) SOLN 2 mg    1.  Repeat intravitreal Avastin OS today, to treat the intraretinal CME from a time to CNVM nasal to the fovea, chronic subretinal fluid from central serous component of the RPE disease  2.  3.  Ophthalmic Meds  Ordered this visit:  Meds ordered this encounter  Medications  . aflibercept (EYLEA) SOLN 2 mg       Return in about 5 weeks (around 11/23/2019) for dilate, OS, AVASTIN OCT.  Patient Instructions  Patient to report promptly any new onset visual acuity decline or distortion    Explained the diagnoses, plan, and follow up with  the patient and they expressed understanding.  Patient expressed understanding of the importance of proper follow up care.   Clent Demark Nakoma Gotwalt M.D. Diseases & Surgery of the Retina and Vitreous [facility] 10/19/19     Abbreviations: M myopia (nearsighted); A astigmatism; H hyperopia (farsighted); P presbyopia; Mrx spectacle prescription;  CTL contact lenses; OD right eye; OS left eye; OU both eyes  XT exotropia; ET esotropia; PEK punctate epithelial keratitis; PEE punctate epithelial erosions; DES dry eye syndrome; MGD meibomian gland dysfunction; ATs artificial tears; PFAT's preservative free artificial tears; [place] nuclear sclerotic cataract; PSC posterior subcapsular cataract; ERM epi-retinal membrane; PVD posterior vitreous detachment; RD retinal detachment; DM diabetes mellitus; DR diabetic retinopathy; NPDR non-proliferative diabetic retinopathy; PDR proliferative diabetic retinopathy; CSME clinically significant macular edema; DME diabetic macular edema; dbh dot blot hemorrhages; CWS cotton wool spot; POAG primary open angle glaucoma; C/D cup-to-disc ratio; HVF humphrey visual field; GVF goldmann visual field; OCT optical coherence tomography; IOP intraocular pressure; BRVO Branch retinal vein occlusion; CRVO central retinal vein occlusion; CRAO central retinal artery occlusion; BRAO branch retinal artery occlusion; RT retinal tear; SB scleral buckle; PPV pars plana vitrectomy; VH Vitreous hemorrhage; PRP panretinal laser photocoagulation; IVK intravitreal kenalog; VMT vitreomacular traction; MH Macular hole;  NVD neovascularization of the disc; NVE  neovascularization elsewhere; AREDS age related eye disease study; ARMD age related macular degeneration; POAG primary open angle glaucoma; EBMD epithelial/anterior basement membrane dystrophy; ACIOL anterior chamber intraocular lens; IOL intraocular lens; PCIOL posterior chamber intraocular lens; Phaco/IOL phacoemulsification with intraocular lens placement; [place] photorefractive keratectomy; LASIK laser assisted in situ keratomileusis; HTN hypertension; DM diabetes mellitus; COPD chronic obstructive pulmonary disease

## 2019-10-21 ENCOUNTER — Encounter (INDEPENDENT_AMBULATORY_CARE_PROVIDER_SITE_OTHER): Payer: 59 | Admitting: Ophthalmology

## 2019-11-07 DIAGNOSIS — G4733 Obstructive sleep apnea (adult) (pediatric): Secondary | ICD-10-CM | POA: Diagnosis not present

## 2019-11-10 MED FILL — CITALOPRAM HBR 40 MG TABLET: 40 | 90 days supply | Qty: 90 | Fill #1

## 2019-11-10 MED FILL — FREESTYLE LITE TEST STRIP: 50 days supply | Qty: 50 | Fill #4

## 2019-11-22 DIAGNOSIS — G4733 Obstructive sleep apnea (adult) (pediatric): Secondary | ICD-10-CM | POA: Diagnosis not present

## 2019-11-23 MED FILL — FREESTYLE LANCETS: 90 days supply | Qty: 100 | Fill #2

## 2019-11-23 MED FILL — ROCKLATAN 0.02-0.005 % SOLN: 0.02-0.005 | 25 days supply | Qty: 3 | Fill #2

## 2019-11-29 ENCOUNTER — Encounter (INDEPENDENT_AMBULATORY_CARE_PROVIDER_SITE_OTHER): Payer: 59 | Admitting: Ophthalmology

## 2019-12-06 DIAGNOSIS — G4733 Obstructive sleep apnea (adult) (pediatric): Secondary | ICD-10-CM | POA: Diagnosis not present

## 2019-12-09 ENCOUNTER — Other Ambulatory Visit: Payer: Self-pay

## 2019-12-09 ENCOUNTER — Encounter (INDEPENDENT_AMBULATORY_CARE_PROVIDER_SITE_OTHER): Payer: Self-pay | Admitting: Ophthalmology

## 2019-12-09 ENCOUNTER — Ambulatory Visit (INDEPENDENT_AMBULATORY_CARE_PROVIDER_SITE_OTHER): Payer: 59 | Admitting: Ophthalmology

## 2019-12-09 DIAGNOSIS — H353221 Exudative age-related macular degeneration, left eye, with active choroidal neovascularization: Secondary | ICD-10-CM | POA: Diagnosis not present

## 2019-12-09 DIAGNOSIS — H35722 Serous detachment of retinal pigment epithelium, left eye: Secondary | ICD-10-CM

## 2019-12-09 MED ORDER — BEVACIZUMAB CHEMO INJECTION 1.25MG/0.05ML SYRINGE FOR KALEIDOSCOPE
1.2500 mg | INTRAVITREAL | Status: AC | PRN
  Administered 2019-12-09: 1.25 mg via INTRAVITREAL

## 2019-12-09 NOTE — Assessment & Plan Note (Signed)
OS, subfoveal pigment epithelial detach which is remained stable since its onset of findings June 2021.  Small pocket of intraretinal fluid superonasal to the fovea however his remained stable if not slightly worse.  Currently at 52-day interval.  We will repeat injection intravitreal Avastin today.  We will consider change in medication next if intraretinal fluid does not abate at the 5-6-week interval

## 2019-12-09 NOTE — Progress Notes (Signed)
12/09/2019     CHIEF COMPLAINT Patient presents for Retina Follow Up   HISTORY OF PRESENT ILLNESS: Nicholas Young is a 64 y.o. male who presents to the clinic today for:   HPI    Retina Follow Up    Patient presents with  Wet AMD.  In left eye.  This started 7 weeks ago.  Severity is mild.  Duration of 7 weeks.  Since onset it is stable.          Comments    7 Week AMD F/U OS, poss Avastin OS  Pt denies noticeable changes to [place] OU since last visit. Pt denies ocular pain, flashes of light, or floaters OU.         Last edited by Rockie Neighbours, [place] on 12/09/2019  8:03 AM. (History)      Referring physician: Gaynelle Arabian, MD [address],  [place] [address]  HISTORICAL INFORMATION:   Selected notes from the MEDICAL RECORD NUMBER       CURRENT MEDICATIONS: Current Outpatient Medications (Ophthalmic Drugs)  Medication Sig  . Aflibercept (EYLEA IO) Inject into the eye.  . brimonidine-timolol (COMBIGAN) 0.2-0.5 % ophthalmic solution Place 1 drop into both eyes every 12 (twelve) hours.  . dorzolamide-timolol (COSOPT) 22.3-6.8 MG/ML ophthalmic solution 1 drop 2 (two) times daily.   No current facility-administered medications for this visit. (Ophthalmic Drugs)   Current Outpatient Medications (Other)  Medication Sig  . Budesonide (RHINOCORT ALLERGY NA) Place into the nose.  . citalopram (CELEXA) 40 MG tablet Take 40 mg by mouth daily.  Marland Kitchen glucosamine-chondroitin 500-400 MG tablet Take 1 tablet by mouth 3 (three) times daily.  . Multiple Vitamins-Minerals (PRESERVISION AREDS PO) Take by mouth.   No current facility-administered medications for this visit. (Other)      REVIEW OF SYSTEMS:    ALLERGIES No Known Allergies  PAST MEDICAL HISTORY Past Medical History:  Diagnosis Date  . Diabetes mellitus without complication ([place])   . Glaucoma   . Sleep apnea    Past Surgical History:  Procedure Laterality Date  . ANTERIOR CERVICAL  DISCECTOMY    . KNEE ARTHROSCOPY      FAMILY HISTORY History reviewed. No pertinent family history.  SOCIAL HISTORY Social History   Tobacco Use  . Smoking status: Never Smoker  . Smokeless tobacco: Never Used  Substance Use Topics  . Alcohol use: Yes    Comment: 1-2 beers a week  . Drug use: Not on file         OPHTHALMIC EXAM:  Base Eye Exam    Visual Acuity (ETDRS)      Right Left   Dist [place] 20/25 +1 20/25       Tonometry (Tonopen, 8:03 AM)      Right Left   Pressure 04 10       Pupils      Pupils Dark Light Shape React APD   Right PERRL 4 3 Round Brisk None   Left PERRL 4 3 Round Brisk None       Visual Fields (Counting fingers)      Left Right    Full Full       Extraocular Movement      Right Left    Full Full       Neuro/Psych    Oriented x3: Yes   Mood/Affect: Normal       Dilation    Left eye: 1.0% Mydriacyl, 2.5% Phenylephrine @ 8:08 AM  Slit Lamp and Fundus Exam    External Exam      Right Left   External Normal Normal       Slit Lamp Exam      Right Left   Lids/Lashes Normal Normal   Conjunctiva/Sclera White and quiet White and quiet   Cornea Clear Clear   Anterior Chamber Deep and quiet Deep and quiet   Iris Round and reactive Round and reactive   Lens Posterior chamber intraocular lens Posterior chamber intraocular lens   Anterior Vitreous Normal Normal       Fundus Exam      Right Left   Posterior Vitreous  Posterior vitreous detachment   Disc  Normal   C/D Ratio  0.25   Macula  Hard drusen, Retinal pigment epithelial mottling, Pigmented atrophy   Vessels  Normal   Periphery  Normal          IMAGING AND PROCEDURES  Imaging and Procedures for 12/09/19  OCT, Retina - OU - Both Eyes       Right Eye Quality was good. Scan locations included subfoveal. Central Foveal Thickness: 292. Findings include retinal drusen , abnormal foveal contour.   Left Eye Quality was good. Scan locations included  subfoveal. Central Foveal Thickness: 303. Findings include pigment epithelial detachment, abnormal foveal contour, retinal drusen , intraretinal fluid.   Notes OD, no signs of CNVM  OS, subfoveal pigment epithelial detach which is remained stable since its onset of findings June 2021.  Small pocket of intraretinal fluid superonasal to the fovea however his remained stable if not slightly worse.  Currently at 52-day interval.         Intravitreal Injection, Pharmacologic Agent - OS - Left Eye       Time Out 12/09/2019. 8:43 AM. Confirmed correct patient, procedure, site, and patient consented.   Anesthesia Topical anesthesia was used. Anesthetic medications included Akten 3.5%.   Procedure Preparation included 5% betadine to ocular surface, 10% betadine to eyelids, Tobramycin 0.3%, Ofloxacin . A 30 gauge needle was used.   Injection:  1.25 mg Bevacizumab (AVASTIN) SOLN   NDC: 70360-001-02, Lot: [id number]   Route: Intravitreal, Site: Left Eye, Waste: 0 mg  Post-op Post injection exam found visual acuity of at least counting fingers. The patient tolerated the procedure well. There were no complications. The patient received written and verbal post procedure care education. Post injection medications were not given.                 ASSESSMENT/PLAN:  Exudative age-related macular degeneration of left eye with active choroidal neovascularization (HCC) OS, subfoveal pigment epithelial detach which is remained stable since its onset of findings June 2021.  Small pocket of intraretinal fluid superonasal to the fovea however his remained stable if not slightly worse.  Currently at 52-day interval.  We will repeat injection intravitreal Avastin today.  We will consider change in medication next if intraretinal fluid does not abate at the 5-6-week interval      ICD-10-CM   1. Exudative age-related macular degeneration of left eye with active choroidal neovascularization (HCC)   H35.3221 OCT, Retina - OU - Both Eyes    Intravitreal Injection, Pharmacologic Agent - OS - Left Eye    Bevacizumab (AVASTIN) SOLN 1.25 mg  2. Serous detachment of retinal pigment epithelium of left eye  H35.722     1.OS, subfoveal pigment epithelial detach which is remained stable since its onset of findings June 2021.  Small pocket of intraretinal fluid  superonasal to the fovea however his remained stable if not slightly worse.  Currently at 52-day interval.  2.  3.  Ophthalmic Meds Ordered this visit:  Meds ordered this encounter  Medications  . Bevacizumab (AVASTIN) SOLN 1.25 mg       Return in about 5 weeks (around 01/13/2020) for dilate, OS, AVASTIN OCT.  There are no Patient Instructions on file for this visit.   Explained the diagnoses, plan, and follow up with the patient and they expressed understanding.  Patient expressed understanding of the importance of proper follow up care.   Clent Demark Agam Davenport M.D. Diseases & Surgery of the Retina and Vitreous [facility] 12/09/19     Abbreviations: M myopia (nearsighted); A astigmatism; H hyperopia (farsighted); P presbyopia; Mrx spectacle prescription;  CTL contact lenses; OD right eye; OS left eye; OU both eyes  XT exotropia; ET esotropia; PEK punctate epithelial keratitis; PEE punctate epithelial erosions; DES dry eye syndrome; MGD meibomian gland dysfunction; ATs artificial tears; PFAT's preservative free artificial tears; [place] nuclear sclerotic cataract; PSC posterior subcapsular cataract; ERM epi-retinal membrane; PVD posterior vitreous detachment; RD retinal detachment; DM diabetes mellitus; DR diabetic retinopathy; NPDR non-proliferative diabetic retinopathy; PDR proliferative diabetic retinopathy; CSME clinically significant macular edema; DME diabetic macular edema; dbh dot blot hemorrhages; CWS cotton wool spot; POAG primary open angle glaucoma; C/D cup-to-disc ratio; HVF humphrey visual field; GVF goldmann  visual field; OCT optical coherence tomography; IOP intraocular pressure; BRVO Branch retinal vein occlusion; CRVO central retinal vein occlusion; CRAO central retinal artery occlusion; BRAO branch retinal artery occlusion; RT retinal tear; SB scleral buckle; PPV pars plana vitrectomy; VH Vitreous hemorrhage; PRP panretinal laser photocoagulation; IVK intravitreal kenalog; VMT vitreomacular traction; MH Macular hole;  NVD neovascularization of the disc; NVE neovascularization elsewhere; AREDS age related eye disease study; ARMD age related macular degeneration; POAG primary open angle glaucoma; EBMD epithelial/anterior basement membrane dystrophy; ACIOL anterior chamber intraocular lens; IOL intraocular lens; PCIOL posterior chamber intraocular lens; Phaco/IOL phacoemulsification with intraocular lens placement; [place] photorefractive keratectomy; LASIK laser assisted in situ keratomileusis; HTN hypertension; DM diabetes mellitus; COPD chronic obstructive pulmonary disease

## 2019-12-10 DIAGNOSIS — E1169 Type 2 diabetes mellitus with other specified complication: Secondary | ICD-10-CM | POA: Diagnosis not present

## 2019-12-10 DIAGNOSIS — R030 Elevated blood-pressure reading, without diagnosis of hypertension: Secondary | ICD-10-CM | POA: Diagnosis not present

## 2019-12-10 DIAGNOSIS — H409 Unspecified glaucoma: Secondary | ICD-10-CM | POA: Diagnosis not present

## 2019-12-10 DIAGNOSIS — G4733 Obstructive sleep apnea (adult) (pediatric): Secondary | ICD-10-CM | POA: Diagnosis not present

## 2019-12-10 DIAGNOSIS — F322 Major depressive disorder, single episode, severe without psychotic features: Secondary | ICD-10-CM | POA: Diagnosis not present

## 2019-12-10 DIAGNOSIS — E785 Hyperlipidemia, unspecified: Secondary | ICD-10-CM | POA: Diagnosis not present

## 2019-12-14 MED FILL — ROCKLATAN 0.02-0.005 % SOLN: 0.02-0.005 | 25 days supply | Qty: 3 | Fill #3

## 2019-12-14 MED FILL — DORZOLAMIDE-TIMOLOL EYE DRP: 22.3-6.8 | 37 days supply | Qty: 10 | Fill #5

## 2020-01-12 MED FILL — FREESTYLE LITE TEST STRIP: 50 days supply | Qty: 50 | Fill #5

## 2020-01-12 MED FILL — ROSUVASTATIN CALCIUM 5 MG T: 5 | 90 days supply | Qty: 90 | Fill #2

## 2020-01-13 ENCOUNTER — Encounter (INDEPENDENT_AMBULATORY_CARE_PROVIDER_SITE_OTHER): Payer: 59 | Admitting: Ophthalmology

## 2020-01-20 ENCOUNTER — Other Ambulatory Visit: Payer: Self-pay

## 2020-01-20 ENCOUNTER — Ambulatory Visit (INDEPENDENT_AMBULATORY_CARE_PROVIDER_SITE_OTHER): Payer: 59 | Admitting: Ophthalmology

## 2020-01-20 ENCOUNTER — Encounter (INDEPENDENT_AMBULATORY_CARE_PROVIDER_SITE_OTHER): Payer: Self-pay | Admitting: Ophthalmology

## 2020-01-20 DIAGNOSIS — H35722 Serous detachment of retinal pigment epithelium, left eye: Secondary | ICD-10-CM

## 2020-01-20 DIAGNOSIS — H353221 Exudative age-related macular degeneration, left eye, with active choroidal neovascularization: Secondary | ICD-10-CM

## 2020-01-20 DIAGNOSIS — H353111 Nonexudative age-related macular degeneration, right eye, early dry stage: Secondary | ICD-10-CM | POA: Diagnosis not present

## 2020-01-20 MED ORDER — BEVACIZUMAB 2.5 MG/0.1ML IZ SOSY
2.5000 mg | PREFILLED_SYRINGE | INTRAVITREAL | Status: AC | PRN
  Administered 2020-01-20: 2.5 mg via INTRAVITREAL

## 2020-01-20 NOTE — Progress Notes (Signed)
01/20/2020     CHIEF COMPLAINT Patient presents for Retina Follow Up   HISTORY OF PRESENT ILLNESS: Nicholas Young is a 64 y.o. male who presents to the clinic today for:   HPI    Retina Follow Up    Patient presents with  Wet AMD.  In left eye.  Severity is moderate.  Duration of 6 weeks.  Since onset it is stable.  I, the attending physician,  performed the HPI with the patient and updated documentation appropriately.          Comments    6 Week Wet AMD f\u OS. Possible Avastin OS. OCT  Pt states no changes in vision. Denies new complaints.       Last edited by Tilda Franco on 01/20/2020  8:24 AM. (History)      Referring physician: Gaynelle Arabian, MD [address],  [place] [id number]  HISTORICAL INFORMATION:   Selected notes from the MEDICAL RECORD NUMBER       CURRENT MEDICATIONS: Current Outpatient Medications (Ophthalmic Drugs)  Medication Sig  . Aflibercept (EYLEA IO) Inject into the eye.  . brimonidine-timolol (COMBIGAN) 0.2-0.5 % ophthalmic solution Place 1 drop into both eyes every 12 (twelve) hours.  . dorzolamide-timolol (COSOPT) 22.3-6.8 MG/ML ophthalmic solution 1 drop 2 (two) times daily.   No current facility-administered medications for this visit. (Ophthalmic Drugs)   Current Outpatient Medications (Other)  Medication Sig  . Budesonide (RHINOCORT ALLERGY NA) Place into the nose.  . citalopram (CELEXA) 40 MG tablet Take 40 mg by mouth daily.  Marland Kitchen glucosamine-chondroitin 500-400 MG tablet Take 1 tablet by mouth 3 (three) times daily.  . Multiple Vitamins-Minerals (PRESERVISION AREDS PO) Take by mouth.   No current facility-administered medications for this visit. (Other)      REVIEW OF SYSTEMS:    ALLERGIES No Known Allergies  PAST MEDICAL HISTORY Past Medical History:  Diagnosis Date  . Diabetes mellitus without complication ([place])   . Glaucoma   . Sleep apnea    Past Surgical History:  Procedure  Laterality Date  . ANTERIOR CERVICAL DISCECTOMY    . KNEE ARTHROSCOPY      FAMILY HISTORY History reviewed. No pertinent family history.  SOCIAL HISTORY Social History   Tobacco Use  . Smoking status: Never Smoker  . Smokeless tobacco: Never Used  Substance Use Topics  . Alcohol use: Yes    Comment: 1-2 beers a week         OPHTHALMIC EXAM: Base Eye Exam    Visual Acuity (Snellen - Linear)      Right Left   Dist [place] 20/20 -1 20/30       Tonometry (Tonopen, 8:27 AM)      Right Left   Pressure 9 12       Pupils      Pupils Dark Light Shape React APD   Right PERRL 3 2 Round Brisk None   Left PERRL 3 2 Round Brisk None       Visual Fields (Counting fingers)      Left Right    Full Full       Neuro/Psych    Oriented x3: Yes   Mood/Affect: Normal       Dilation    Left eye: 1.0% Mydriacyl, 2.5% Phenylephrine @ 8:27 AM        Slit Lamp and Fundus Exam    External Exam      Right Left   External Normal  Normal       Slit Lamp Exam      Right Left   Lids/Lashes Normal Normal   Conjunctiva/Sclera 1+ Injection White and quiet   Cornea Clear Clear   Anterior Chamber Deep and quiet Deep and quiet   Iris Round and reactive Round and reactive   Lens Posterior chamber intraocular lens Posterior chamber intraocular lens   Anterior Vitreous Normal Normal       Fundus Exam      Right Left   Posterior Vitreous  Posterior vitreous detachment   Disc  Normal   C/D Ratio  0.25   Macula  Hard drusen, Retinal pigment epithelial mottling, Pigmented atrophy,, , no macular thickening, no hemorrhage   Vessels  Normal   Periphery  Normal          IMAGING AND PROCEDURES  Imaging and Procedures for 01/20/20  OCT, Retina - OU - Both Eyes       Right Eye Quality was good. Scan locations included subfoveal. Central Foveal Thickness: 292. Findings include retinal drusen , abnormal foveal contour.   Left Eye Quality was good. Scan locations included subfoveal.  Central Foveal Thickness: 313. Findings include pigment epithelial detachment, abnormal foveal contour, retinal drusen , intraretinal fluid.   Notes OD, no signs of CNVM  OS, subfoveal pigment epithelial detach which is remained stable since its onset of findings June 2021.  Small area of intraretinal fluid superonasal to the fovea left eye has improved nicely at 5-week follow-up interval on Avastin.  We will repeat injection today        Intravitreal Injection, Pharmacologic Agent - OS - Left Eye       Time Out 01/20/2020. 9:17 AM. Confirmed correct patient, procedure, site, and patient consented.   Anesthesia Topical anesthesia was used. Anesthetic medications included Akten 3.5%.   Procedure Preparation included 5% betadine to ocular surface, 10% betadine to eyelids, Tobramycin 0.3%, Ofloxacin . A 30 gauge needle was used.   Injection:  2.5 mg Bevacizumab (AVASTIN) 2.5mg /0.83mL SOSY   NDC: 93570-177-93, Lot: [id number]   Route: Intravitreal, Site: Left Eye  Post-op Post injection exam found visual acuity of at least counting fingers. The patient tolerated the procedure well. There were no complications. The patient received written and verbal post procedure care education. Post injection medications were not given.                 ASSESSMENT/PLAN:  Early stage nonexudative age-related macular degeneration of right eye Stable OD, no signs of significant drusen deposition OD, and certainly no CNVM  Exudative age-related macular degeneration of left eye with active choroidal neovascularization (HCC) Much less intraretinal fluid superonasal to the fovea left eye 5-week status post intravitreal Avastin.  I explained the patient this does suggest the ongoing need for therapy and will repeat intravitreal Avastin today and examination again in 5 weeks      ICD-10-CM   1. Exudative age-related macular degeneration of left eye with active choroidal neovascularization (HCC)   H35.3221 OCT, Retina - OU - Both Eyes    Intravitreal Injection, Pharmacologic Agent - OS - Left Eye    bevacizumab (AVASTIN) SOSY 2.5 mg  2. Early stage nonexudative age-related macular degeneration of right eye  H35.3111   3. Serous detachment of retinal pigment epithelium of left eye  H35.722     1.  OS much improved today, anatomy and much less subretinal fluid on Avastin currently at 5-week follow-up interval.  We will repeat injection today  and examination again left eye in 5 weeks  2.  Discussed with the patient we will commit to a nearly 1 year.  At 5-week interval examinations consideration of therapy to maximally induce quiescent's of this chronic disease OS  3.  Ophthalmic Meds Ordered this visit:  Meds ordered this encounter  Medications  . bevacizumab (AVASTIN) SOSY 2.5 mg       Return in about 5 weeks (around 02/24/2020) for dilate, OS, AVASTIN OCT.  There are no Patient Instructions on file for this visit.   Explained the diagnoses, plan, and follow up with the patient and they expressed understanding.  Patient expressed understanding of the importance of proper follow up care.   Clent Demark Evelyn Aguinaldo M.D. Diseases & Surgery of the Retina and Vitreous Retina [facility] Diabetic [place] 01/20/20     Abbreviations: M myopia (nearsighted); A astigmatism; H hyperopia (farsighted); P presbyopia; Mrx spectacle prescription;  CTL contact lenses; OD right eye; OS left eye; OU both eyes  XT exotropia; ET esotropia; PEK punctate epithelial keratitis; PEE punctate epithelial erosions; DES dry eye syndrome; MGD meibomian gland dysfunction; ATs artificial tears; PFAT's preservative free artificial tears; [place] nuclear sclerotic cataract; PSC posterior subcapsular cataract; ERM epi-retinal membrane; PVD posterior vitreous detachment; RD retinal detachment; DM diabetes mellitus; DR diabetic retinopathy; NPDR non-proliferative diabetic retinopathy; PDR proliferative diabetic retinopathy; CSME  clinically significant macular edema; DME diabetic macular edema; dbh dot blot hemorrhages; CWS cotton wool spot; POAG primary open angle glaucoma; C/D cup-to-disc ratio; HVF humphrey visual field; GVF goldmann visual field; OCT optical coherence tomography; IOP intraocular pressure; BRVO Branch retinal vein occlusion; CRVO central retinal vein occlusion; CRAO central retinal artery occlusion; BRAO branch retinal artery occlusion; RT retinal tear; SB scleral buckle; PPV pars plana vitrectomy; VH Vitreous hemorrhage; PRP panretinal laser photocoagulation; IVK intravitreal kenalog; VMT vitreomacular traction; MH Macular hole;  NVD neovascularization of the disc; NVE neovascularization elsewhere; AREDS age related eye disease study; ARMD age related macular degeneration; POAG primary open angle glaucoma; EBMD epithelial/anterior basement membrane dystrophy; ACIOL anterior chamber intraocular lens; IOL intraocular lens; PCIOL posterior chamber intraocular lens; Phaco/IOL phacoemulsification with intraocular lens placement; [place] photorefractive keratectomy; LASIK laser assisted in situ keratomileusis; HTN hypertension; DM diabetes mellitus; COPD chronic obstructive pulmonary disease

## 2020-01-20 NOTE — Assessment & Plan Note (Signed)
Much less intraretinal fluid superonasal to the fovea left eye 5-week status post intravitreal Avastin.  I explained the patient this does suggest the ongoing need for therapy and will repeat intravitreal Avastin today and examination again in 5 weeks

## 2020-01-20 NOTE — Assessment & Plan Note (Signed)
Stable OD, no signs of significant drusen deposition OD, and certainly no CNVM

## 2020-01-25 MED FILL — ROCKLATAN 0.02-0.005 % SOLN: 0.02-0.005 | 25 days supply | Qty: 3 | Fill #4

## 2020-01-25 MED FILL — DORZOLAMIDE-TIMOLOL EYE DRP: 22.3-6.8 | 37 days supply | Qty: 10 | Fill #6

## 2020-02-06 DIAGNOSIS — G4733 Obstructive sleep apnea (adult) (pediatric): Secondary | ICD-10-CM | POA: Diagnosis not present

## 2020-02-10 MED FILL — CITALOPRAM HBR 40 MG TABLET: 40 | 90 days supply | Qty: 90 | Fill #2

## 2020-02-14 ENCOUNTER — Other Ambulatory Visit (HOSPITAL_COMMUNITY): Payer: Self-pay | Admitting: Endodontics

## 2020-02-14 MED FILL — AMOXICILLIN 500 MG CAPSULE: 500 | 6 days supply | Qty: 21 | Fill #0

## 2020-02-21 DIAGNOSIS — G4733 Obstructive sleep apnea (adult) (pediatric): Secondary | ICD-10-CM | POA: Diagnosis not present

## 2020-02-24 ENCOUNTER — Encounter (INDEPENDENT_AMBULATORY_CARE_PROVIDER_SITE_OTHER): Payer: 59 | Admitting: Ophthalmology

## 2020-03-02 ENCOUNTER — Ambulatory Visit (INDEPENDENT_AMBULATORY_CARE_PROVIDER_SITE_OTHER): Payer: 59 | Admitting: Ophthalmology

## 2020-03-02 ENCOUNTER — Encounter (INDEPENDENT_AMBULATORY_CARE_PROVIDER_SITE_OTHER): Payer: Self-pay | Admitting: Ophthalmology

## 2020-03-02 ENCOUNTER — Encounter (INDEPENDENT_AMBULATORY_CARE_PROVIDER_SITE_OTHER): Payer: 59 | Admitting: Ophthalmology

## 2020-03-02 ENCOUNTER — Other Ambulatory Visit: Payer: Self-pay

## 2020-03-02 DIAGNOSIS — H353221 Exudative age-related macular degeneration, left eye, with active choroidal neovascularization: Secondary | ICD-10-CM | POA: Diagnosis not present

## 2020-03-02 DIAGNOSIS — H35722 Serous detachment of retinal pigment epithelium, left eye: Secondary | ICD-10-CM

## 2020-03-02 DIAGNOSIS — H353111 Nonexudative age-related macular degeneration, right eye, early dry stage: Secondary | ICD-10-CM

## 2020-03-02 DIAGNOSIS — J029 Acute pharyngitis, unspecified: Secondary | ICD-10-CM | POA: Diagnosis not present

## 2020-03-02 MED ORDER — BEVACIZUMAB 2.5 MG/0.1ML IZ SOSY
2.5000 mg | PREFILLED_SYRINGE | INTRAVITREAL | Status: AC | PRN
  Administered 2020-03-02: 2.5 mg via INTRAVITREAL

## 2020-03-02 NOTE — Assessment & Plan Note (Signed)
Stable, no CNVM

## 2020-03-02 NOTE — Progress Notes (Addendum)
03/02/2020     CHIEF COMPLAINT Patient presents for Retina Follow Up (5 WK FU OS, POSS AVASTIN OS///Pt reports stable vision OU, pt denies any new F/F OU, pt denies any pain or pressure OU. )   HISTORY OF PRESENT ILLNESS: Nicholas Young is a 65 y.o. male who presents to the clinic today for:   HPI    Retina Follow Up    Patient presents with  Wet AMD.  In left eye.  This started 5 weeks ago.  Duration of 5 weeks.  Since onset it is stable. Additional comments: 5 WK FU OS, POSS AVASTIN OS   Pt reports stable vision OU, pt denies any new F/F OU, pt denies any pain or pressure OU.        Last edited by Nichola Sizer D on 03/02/2020  8:46 AM. (History)      Referring physician: Gaynelle Arabian, MD [address],  [place] [id number]  HISTORICAL INFORMATION:   Selected notes from the MEDICAL RECORD NUMBER       CURRENT MEDICATIONS: Current Outpatient Medications (Ophthalmic Drugs)  Medication Sig  . dorzolamide-timolol (COSOPT) 22.3-6.8 MG/ML ophthalmic solution 1 drop 2 (two) times daily.  . Netarsudil-Latanoprost (ROCKLATAN) 0.02-0.005 % SOLN Apply 1 drop to eye at bedtime. In both eyes.  . Aflibercept (EYLEA IO) Inject into the eye.  . brimonidine-timolol (COMBIGAN) 0.2-0.5 % ophthalmic solution Place 1 drop into both eyes every 12 (twelve) hours. (Patient not taking: Reported on 03/02/2020)   No current facility-administered medications for this visit. (Ophthalmic Drugs)   Current Outpatient Medications (Other)  Medication Sig  . Budesonide (RHINOCORT ALLERGY NA) Place into the nose.  . citalopram (CELEXA) 40 MG tablet Take 40 mg by mouth daily.  Marland Kitchen glucosamine-chondroitin 500-400 MG tablet Take 1 tablet by mouth 3 (three) times daily.  . Multiple Vitamins-Minerals (PRESERVISION AREDS PO) Take by mouth.   No current facility-administered medications for this visit. (Other)      REVIEW OF SYSTEMS:    ALLERGIES No Known Allergies  PAST  MEDICAL HISTORY Past Medical History:  Diagnosis Date  . Diabetes mellitus without complication ([place])   . Glaucoma   . Sleep apnea    Past Surgical History:  Procedure Laterality Date  . ANTERIOR CERVICAL DISCECTOMY    . KNEE ARTHROSCOPY      FAMILY HISTORY History reviewed. No pertinent family history.  SOCIAL HISTORY Social History   Tobacco Use  . Smoking status: Never Smoker  . Smokeless tobacco: Never Used  Substance Use Topics  . Alcohol use: Yes    Comment: 1-2 beers a week         OPHTHALMIC EXAM:  Base Eye Exam    Visual Acuity (ETDRS)      Right Left   Dist [place] 20/20 -1 20/25 -1       Tonometry (Tonopen, 8:53 AM)      Right Left   Pressure 13 13       Pupils      Pupils Dark Light Shape React APD   Right PERRL 3 2 Round Brisk None   Left PERRL 3 2 Round Brisk None       Visual Fields (Counting fingers)      Left Right    Full Full       Extraocular Movement      Right Left    Full Full       Neuro/Psych    Oriented x3: Yes  Mood/Affect: Normal        Slit Lamp and Fundus Exam    External Exam      Right Left   External Normal Normal       Slit Lamp Exam      Right Left   Lids/Lashes Normal Normal   Conjunctiva/Sclera 1+ Injection White and quiet   Cornea Clear Clear   Anterior Chamber Deep and quiet Deep and quiet   Iris Round and reactive Round and reactive   Lens Posterior chamber intraocular lens Posterior chamber intraocular lens   Anterior Vitreous Normal Normal       Fundus Exam      Right Left   Posterior Vitreous  Posterior vitreous detachment   Disc  Normal   C/D Ratio  0.2   Macula  Hard drusen, Retinal pigment epithelial mottling, Pigmented atrophy,, , no macular thickening, no hemorrhage   Vessels  Normal   Periphery  Normal          IMAGING AND PROCEDURES  Imaging and Procedures for 03/02/20  OCT, Retina - OU - Both Eyes       Right Eye Quality was good. Scan locations included subfoveal.  Central Foveal Thickness: 296. Progression has been stable. Findings include retinal drusen , abnormal foveal contour.   Left Eye Quality was good. Scan locations included subfoveal. Central Foveal Thickness: 310. Progression has been stable. Findings include pigment epithelial detachment, abnormal foveal contour, retinal drusen , intraretinal fluid.   Notes OD, no signs of CNVM  OS, subfoveal pigment epithelial detach which is remained stable since its onset of findings June 2021.  Small area of intraretinal fluid superonasal to the fovea left eye has improved nicely at 6-week follow-up interval on Avastin.  We will repeat injection today        Intravitreal Injection, Pharmacologic Agent - OS - Left Eye       Time Out 03/02/2020. 10:00 AM. Confirmed correct patient, procedure, site, and patient consented.   Anesthesia Topical anesthesia was used. Anesthetic medications included Akten 3.5%.   Procedure Preparation included 5% betadine to ocular surface, 10% betadine to eyelids, Tobramycin 0.3%, Ofloxacin . A 30 gauge needle was used.   Injection:  2.5 mg Bevacizumab (AVASTIN) 2.5mg /0.55mL SOSY   NDC: [id number], LotSE:[id number]   Route: Intravitreal, Site: Left Eye  Post-op Post injection exam found visual acuity of at least counting fingers. The patient tolerated the procedure well. There were no complications. The patient received written and verbal post procedure care education. Post injection medications were not given.                 ASSESSMENT/PLAN:  Exudative age-related macular degeneration of left eye with active choroidal neovascularization (HCC) history of recurrences with intraretinal fluid component around the pigment epithelial detachment, will retreat today at 6-week interval to maintain.  Recent onset commencement June 2021, will treat at the every 6 weeks out to June 2022 and then reconsider treat and extend  Early stage nonexudative age-related  macular degeneration of right eye Stable, no CNVM  Serous detachment of retinal pigment epithelium of left eye OS no change      ICD-10-CM   1. Exudative age-related macular degeneration of left eye with active choroidal neovascularization (HCC)  H35.3221 OCT, Retina - OU - Both Eyes    Intravitreal Injection, Pharmacologic Agent - OS - Left Eye    bevacizumab (AVASTIN) SOSY 2.5 mg  2. Early stage nonexudative age-related macular degeneration of right eye  H35.3111   3. Serous detachment of retinal pigment epithelium of left eye  H35.722     1.  2.  3.  Ophthalmic Meds Ordered this visit:  Meds ordered this encounter  Medications  . bevacizumab (AVASTIN) SOSY 2.5 mg       Return in about 6 weeks (around 04/13/2020) for dilate, OS, AVASTIN OCT.  There are no Patient Instructions on file for this visit.   Explained the diagnoses, plan, and follow up with the patient and they expressed understanding.  Patient expressed understanding of the importance of proper follow up care.   Clent Demark Danaye Sobh M.D. Diseases & Surgery of the Retina and Vitreous Retina & Diabetic [place] 03/02/20     Abbreviations: M myopia (nearsighted); A astigmatism; H hyperopia (farsighted); P presbyopia; Mrx spectacle prescription;  CTL contact lenses; OD right eye; OS left eye; OU both eyes  XT exotropia; ET esotropia; PEK punctate epithelial keratitis; PEE punctate epithelial erosions; DES dry eye syndrome; MGD meibomian gland dysfunction; ATs artificial tears; PFAT's preservative free artificial tears; [place] nuclear sclerotic cataract; PSC posterior subcapsular cataract; ERM epi-retinal membrane; PVD posterior vitreous detachment; RD retinal detachment; DM diabetes mellitus; DR diabetic retinopathy; NPDR non-proliferative diabetic retinopathy; PDR proliferative diabetic retinopathy; CSME clinically significant macular edema; DME diabetic macular edema; dbh dot blot hemorrhages; CWS cotton wool spot; POAG  primary open angle glaucoma; C/D cup-to-disc ratio; HVF humphrey visual field; GVF goldmann visual field; OCT optical coherence tomography; IOP intraocular pressure; BRVO Branch retinal vein occlusion; CRVO central retinal vein occlusion; CRAO central retinal artery occlusion; BRAO branch retinal artery occlusion; RT retinal tear; SB scleral buckle; PPV pars plana vitrectomy; VH Vitreous hemorrhage; PRP panretinal laser photocoagulation; IVK intravitreal kenalog; VMT vitreomacular traction; MH Macular hole;  NVD neovascularization of the disc; NVE neovascularization elsewhere; AREDS age related eye disease study; ARMD age related macular degeneration; POAG primary open angle glaucoma; EBMD epithelial/anterior basement membrane dystrophy; ACIOL anterior chamber intraocular lens; IOL intraocular lens; PCIOL posterior chamber intraocular lens; Phaco/IOL phacoemulsification with intraocular lens placement; [place] photorefractive keratectomy; LASIK laser assisted in situ keratomileusis; HTN hypertension; DM diabetes mellitus; COPD chronic obstructive pulmonary disease

## 2020-03-02 NOTE — Assessment & Plan Note (Signed)
OS no change

## 2020-03-02 NOTE — Assessment & Plan Note (Signed)
history of recurrences with intraretinal fluid component around the pigment epithelial detachment, will retreat today at 6-week interval to maintain.  Recent onset commencement June 2021, will treat at the every 6 weeks out to June 2022 and then reconsider treat and extend

## 2020-03-06 DIAGNOSIS — G4733 Obstructive sleep apnea (adult) (pediatric): Secondary | ICD-10-CM | POA: Diagnosis not present

## 2020-03-06 MED FILL — AMOXICILLIN 500 MG CAPSULE: 500 | 6 days supply | Qty: 21 | Fill #1

## 2020-03-08 DIAGNOSIS — G4733 Obstructive sleep apnea (adult) (pediatric): Secondary | ICD-10-CM | POA: Diagnosis not present

## 2020-03-16 MED FILL — DORZOLAMIDE-TIMOLOL EYE DRP: 22.3-6.8 | 37 days supply | Qty: 10 | Fill #0

## 2020-03-16 MED FILL — ROCKLATAN 0.02-0.005 % SOLN: 0.02-0.005 | 25 days supply | Qty: 3 | Fill #5

## 2020-03-20 DIAGNOSIS — G4733 Obstructive sleep apnea (adult) (pediatric): Secondary | ICD-10-CM | POA: Diagnosis not present

## 2020-04-08 DIAGNOSIS — G4733 Obstructive sleep apnea (adult) (pediatric): Secondary | ICD-10-CM | POA: Diagnosis not present

## 2020-04-10 MED FILL — ROCKLATAN 0.02-0.005 % SOLN: 0.02-0.005 | 25 days supply | Qty: 3 | Fill #6

## 2020-04-10 MED FILL — ROSUVASTATIN CALCIUM 5 MG T: 5 | 90 days supply | Qty: 90 | Fill #3

## 2020-04-11 ENCOUNTER — Other Ambulatory Visit: Payer: Self-pay

## 2020-04-11 ENCOUNTER — Encounter (INDEPENDENT_AMBULATORY_CARE_PROVIDER_SITE_OTHER): Payer: Self-pay | Admitting: Ophthalmology

## 2020-04-11 ENCOUNTER — Ambulatory Visit (INDEPENDENT_AMBULATORY_CARE_PROVIDER_SITE_OTHER): Payer: 59 | Admitting: Ophthalmology

## 2020-04-11 DIAGNOSIS — H35722 Serous detachment of retinal pigment epithelium, left eye: Secondary | ICD-10-CM

## 2020-04-11 DIAGNOSIS — H353221 Exudative age-related macular degeneration, left eye, with active choroidal neovascularization: Secondary | ICD-10-CM | POA: Diagnosis not present

## 2020-04-11 MED ORDER — BEVACIZUMAB 2.5 MG/0.1ML IZ SOSY
2.5000 mg | PREFILLED_SYRINGE | INTRAVITREAL | Status: AC | PRN
  Administered 2020-04-11: 2.5 mg via INTRAVITREAL

## 2020-04-11 NOTE — Assessment & Plan Note (Signed)
Subfoveal PED, not related to the intraretinal fluid noted and imaged on June 2021, the subfoveal component of disease has not been associated with wet AMD

## 2020-04-11 NOTE — Assessment & Plan Note (Signed)
OS with improved retinal findings, muchintraretinal or subretinal fluid remains.  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  Intraretinal fluid has completely resolved, return follow-up visit in 6 weeks

## 2020-04-11 NOTE — Progress Notes (Signed)
04/11/2020     CHIEF COMPLAINT Patient presents for Retina Follow Up (6 Week Wet AMD f\u OS. Possible Avastin OS. OCT/Pt states no changes in vision. Denies new complaints.)   HISTORY OF PRESENT ILLNESS: Nicholas Young is a 65 y.o. male who presents to the clinic today for:   HPI    Retina Follow Up    Patient presents with  Wet AMD.  In left eye.  Severity is moderate.  Duration of 6 weeks.  Since onset it is stable.  I, the attending physician,  performed the HPI with the patient and updated documentation appropriately. Additional comments: 6 Week Wet AMD f\u OS. Possible Avastin OS. OCT Pt states no changes in vision. Denies new complaints.       Last edited by Tilda Franco on 04/11/2020  8:16 AM. (History)      Referring physician: Gaynelle Arabian, MD [address],  [place] [address]  HISTORICAL INFORMATION:   Selected notes from the MEDICAL RECORD NUMBER       CURRENT MEDICATIONS: Current Outpatient Medications (Ophthalmic Drugs)  Medication Sig  . Aflibercept (EYLEA IO) Inject into the eye.  . brimonidine-timolol (COMBIGAN) 0.2-0.5 % ophthalmic solution Place 1 drop into both eyes every 12 (twelve) hours. (Patient not taking: Reported on 03/02/2020)  . dorzolamide-timolol (COSOPT) 22.3-6.8 MG/ML ophthalmic solution 1 drop 2 (two) times daily.  . Netarsudil-Latanoprost (ROCKLATAN) 0.02-0.005 % SOLN Apply 1 drop to eye at bedtime. In both eyes.   No current facility-administered medications for this visit. (Ophthalmic Drugs)   Current Outpatient Medications (Other)  Medication Sig  . Budesonide (RHINOCORT ALLERGY NA) Place into the nose.  . citalopram (CELEXA) 40 MG tablet Take 40 mg by mouth daily.  Marland Kitchen glucosamine-chondroitin 500-400 MG tablet Take 1 tablet by mouth 3 (three) times daily.  . Multiple Vitamins-Minerals (PRESERVISION AREDS PO) Take by mouth.   No current facility-administered medications for this visit. (Other)      REVIEW  OF SYSTEMS:    ALLERGIES No Known Allergies  PAST MEDICAL HISTORY Past Medical History:  Diagnosis Date  . Diabetes mellitus without complication ([place])   . Glaucoma   . Sleep apnea    Past Surgical History:  Procedure Laterality Date  . ANTERIOR CERVICAL DISCECTOMY    . KNEE ARTHROSCOPY      FAMILY HISTORY History reviewed. No pertinent family history.  SOCIAL HISTORY Social History   Tobacco Use  . Smoking status: Never Smoker  . Smokeless tobacco: Never Used  Substance Use Topics  . Alcohol use: Yes    Comment: 1-2 beers a week         OPHTHALMIC EXAM:  Base Eye Exam    Visual Acuity (Snellen - Linear)      Right Left   Dist [place] 20/20 20/25       Tonometry (Tonopen, 8:20 AM)      Right Left   Pressure 10 12       Pupils      Pupils Dark Light Shape React APD   Right PERRL 3 2 Round Brisk None   Left PERRL 3 2 Round Brisk None       Visual Fields (Counting fingers)      Left Right    Full Full       Neuro/Psych    Oriented x3: Yes   Mood/Affect: Normal       Dilation    Left eye: 1.0% Mydriacyl, 2.5% Phenylephrine @ 8:20  AM        Slit Lamp and Fundus Exam    External Exam      Right Left   External Normal Normal       Slit Lamp Exam      Right Left   Lids/Lashes Normal Normal   Conjunctiva/Sclera 1+ Injection White and quiet   Cornea Clear Clear   Anterior Chamber Deep and quiet Deep and quiet   Iris Round and reactive Round and reactive   Lens Posterior chamber intraocular lens Posterior chamber intraocular lens   Anterior Vitreous Normal Normal       Fundus Exam      Right Left   Posterior Vitreous  Posterior vitreous detachment   Disc  Normal   C/D Ratio  0.2   Macula  Hard drusen, Retinal pigment epithelial mottling, Pigmented atrophy,, , no macular thickening, no hemorrhage   Vessels  Normal   Periphery  Normal          IMAGING AND PROCEDURES  Imaging and Procedures for 04/11/20  OCT, Retina - OU - Both Eyes        Right Eye Quality was good. Scan locations included subfoveal. Central Foveal Thickness: 298. Progression has been stable.   Left Eye Quality was good. Scan locations included subfoveal. Central Foveal Thickness: 310. Progression has been stable. Findings include pigment epithelial detachment.   Notes Intraretinal fluid and active CNVM noted June 2021 continues to be completely resolved, currently at 6-week interval.  Plan is to continue to June 2022 prior to any  attempt at lengthening interval examination       Intravitreal Injection, Pharmacologic Agent - OS - Left Eye       Time Out 04/11/2020. 8:56 AM. Confirmed correct patient, procedure, site, and patient consented.   Anesthesia Anesthetic medications included Akten 3.5%.   Procedure Preparation included Ofloxacin . A 30 gauge needle was used.   Injection:  2.5 mg Bevacizumab (AVASTIN) 2.5mg /0.92mL SOSY   NDC: 81017-510-25   Route: Intravitreal, Site: Left Eye  Post-op Post injection exam found visual acuity of at least counting fingers. The patient tolerated the procedure well. There were no complications. The patient received written and verbal post procedure care education. Post injection medications were not given.                 ASSESSMENT/PLAN:  Exudative age-related macular degeneration of left eye with active choroidal neovascularization (HCC) OS with improved retinal findings, muchintraretinal or subretinal fluid remains.  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  Intraretinal fluid has completely resolved, return follow-up visit in 6 weeks  Serous detachment of retinal pigment epithelium of left eye Subfoveal PED, not related to the intraretinal fluid noted and imaged on June 2021, the subfoveal component of disease has not been associated with wet AMD      ICD-10-CM    1. Exudative age-related macular degeneration of left eye with active choroidal neovascularization (HCC)  H35.3221 OCT, Retina - OU - Both Eyes    Intravitreal Injection, Pharmacologic Agent - OS - Left Eye    bevacizumab (AVASTIN) SOSY 2.5 mg  2. Serous detachment of retinal pigment epithelium of left eye  H35.722     1.  OS, with recurrent CNVM with most recent intraretinal fluid noted June 2021.  2.  History of CS CR, with sick RPE OS likely attributing to  the risk of CNVM formation in the left eye  3.  Today at 6-week follow-up, no intraretinal fluid, plan is continue evaluation every 6 weeks likely injection until June 2022 at which time we may consider extending interval of examinations by 1 week at a time  Ophthalmic Meds Ordered this visit:  Meds ordered this encounter  Medications  . bevacizumab (AVASTIN) SOSY 2.5 mg       Return in about 6 weeks (around 05/23/2020) for dilate, OS, AVASTIN OCT.  There are no Patient Instructions on file for this visit.   Explained the diagnoses, plan, and follow up with the patient and they expressed understanding.  Patient expressed understanding of the importance of proper follow up care.   Clent Demark Rankin M.D. Diseases & Surgery of the Retina and Vitreous Retina & Diabetic [place] 04/11/20     Abbreviations: M myopia (nearsighted); A astigmatism; H hyperopia (farsighted); P presbyopia; Mrx spectacle prescription;  CTL contact lenses; OD right eye; OS left eye; OU both eyes  XT exotropia; ET esotropia; PEK punctate epithelial keratitis; PEE punctate epithelial erosions; DES dry eye syndrome; MGD meibomian gland dysfunction; ATs artificial tears; PFAT's preservative free artificial tears; [place] nuclear sclerotic cataract; PSC posterior subcapsular cataract; ERM epi-retinal membrane; PVD posterior vitreous detachment; RD retinal detachment; DM diabetes mellitus; DR diabetic retinopathy; NPDR non-proliferative diabetic retinopathy; PDR  proliferative diabetic retinopathy; CSME clinically significant macular edema; DME diabetic macular edema; dbh dot blot hemorrhages; CWS cotton wool spot; POAG primary open angle glaucoma; C/D cup-to-disc ratio; HVF humphrey visual field; GVF goldmann visual field; OCT optical coherence tomography; IOP intraocular pressure; BRVO Branch retinal vein occlusion; CRVO central retinal vein occlusion; CRAO central retinal artery occlusion; BRAO branch retinal artery occlusion; RT retinal tear; SB scleral buckle; PPV pars plana vitrectomy; VH Vitreous hemorrhage; PRP panretinal laser photocoagulation; IVK intravitreal kenalog; VMT vitreomacular traction; MH Macular hole;  NVD neovascularization of the disc; NVE neovascularization elsewhere; AREDS age related eye disease study; ARMD age related macular degeneration; POAG primary open angle glaucoma; EBMD epithelial/anterior basement membrane dystrophy; ACIOL anterior chamber intraocular lens; IOL intraocular lens; PCIOL posterior chamber intraocular lens; Phaco/IOL phacoemulsification with intraocular lens placement; PRK photorefractive keratectomy; LASIK laser assisted in situ keratomileusis; HTN hypertension; DM diabetes mellitus; COPD chronic obstructive pulmonary disease   04/11/2020     CHIEF COMPLAINT Patient presents for Retina Follow Up (6 Week Wet AMD f\u OS. Possible Avastin OS. OCT/Pt states no changes in vision. Denies new complaints.)   HISTORY OF PRESENT ILLNESS: Nicholas Young is a 65 y.o. male who presents to the clinic today for:   HPI    Retina Follow Up    Patient presents with  Wet AMD.  In left eye.  Severity is moderate.  Duration of 6 weeks.  Since onset it is stable.  I, the attending physician,  performed the HPI with the patient and updated documentation appropriately. Additional comments: 6 Week Wet AMD f\u OS. Possible Avastin OS. OCT Pt states no changes in vision. Denies new complaints.       Last edited by Tilda Franco  on 04/11/2020  8:16 AM. (History)      Referring physician: Gaynelle Arabian, MD [address],   [id number]  HISTORICAL INFORMATION:   Selected notes from the MEDICAL RECORD NUMBER       CURRENT MEDICATIONS: Current Outpatient Medications (Ophthalmic Drugs)  Medication Sig  . Aflibercept (EYLEA IO) Inject into the eye.  . brimonidine-timolol (COMBIGAN) 0.2-0.5 %  ophthalmic solution Place 1 drop into both eyes every 12 (twelve) hours. (Patient not taking: Reported on 03/02/2020)  . dorzolamide-timolol (COSOPT) 22.3-6.8 MG/ML ophthalmic solution 1 drop 2 (two) times daily.  . Netarsudil-Latanoprost (ROCKLATAN) 0.02-0.005 % SOLN Apply 1 drop to eye at bedtime. In both eyes.   No current facility-administered medications for this visit. (Ophthalmic Drugs)   Current Outpatient Medications (Other)  Medication Sig  . Budesonide (RHINOCORT ALLERGY NA) Place into the nose.  . citalopram (CELEXA) 40 MG tablet Take 40 mg by mouth daily.  Marland Kitchen glucosamine-chondroitin 500-400 MG tablet Take 1 tablet by mouth 3 (three) times daily.  . Multiple Vitamins-Minerals (PRESERVISION AREDS PO) Take by mouth.   No current facility-administered medications for this visit. (Other)      REVIEW OF SYSTEMS:    ALLERGIES No Known Allergies  PAST MEDICAL HISTORY Past Medical History:  Diagnosis Date  . Diabetes mellitus without complication ([place])   . Glaucoma   . Sleep apnea    Past Surgical History:  Procedure Laterality Date  . ANTERIOR CERVICAL DISCECTOMY    . KNEE ARTHROSCOPY      FAMILY HISTORY History reviewed. No pertinent family history.  SOCIAL HISTORY Social History   Tobacco Use  . Smoking status: Never Smoker  . Smokeless tobacco: Never Used  Substance Use Topics  . Alcohol use: Yes    Comment: 1-2 beers a week         OPHTHALMIC EXAM:  Base Eye Exam    Visual Acuity (Snellen - Linear)      Right Left   Dist [place] 20/20 20/25       Tonometry  (Tonopen, 8:20 AM)      Right Left   Pressure 10 12       Pupils      Pupils Dark Light Shape React APD   Right PERRL 3 2 Round Brisk None   Left PERRL 3 2 Round Brisk None       Visual Fields (Counting fingers)      Left Right    Full Full       Neuro/Psych    Oriented x3: Yes   Mood/Affect: Normal       Dilation    Left eye: 1.0% Mydriacyl, 2.5% Phenylephrine @ 8:20 AM        Slit Lamp and Fundus Exam    External Exam      Right Left   External Normal Normal       Slit Lamp Exam      Right Left   Lids/Lashes Normal Normal   Conjunctiva/Sclera 1+ Injection White and quiet   Cornea Clear Clear   Anterior Chamber Deep and quiet Deep and quiet   Iris Round and reactive Round and reactive   Lens Posterior chamber intraocular lens Posterior chamber intraocular lens   Anterior Vitreous Normal Normal       Fundus Exam      Right Left   Posterior Vitreous  Posterior vitreous detachment   Disc  Normal   C/D Ratio  0.2   Macula  Hard drusen, Retinal pigment epithelial mottling, Pigmented atrophy,, , no macular thickening, no hemorrhage   Vessels  Normal   Periphery  Normal          IMAGING AND PROCEDURES  Imaging and Procedures for 04/11/20  OCT, Retina - OU - Both Eyes       Right Eye Quality was good. Scan locations included subfoveal. Central Foveal Thickness: 298. Progression has  been stable.   Left Eye Quality was good. Scan locations included subfoveal. Central Foveal Thickness: 310. Progression has been stable. Findings include pigment epithelial detachment.   Notes Intraretinal fluid and active CNVM noted June 2021 continues to be completely resolved, currently at 6-week interval.  Plan is to continue to June 2022 prior to any  attempt at lengthening interval examination       Intravitreal Injection, Pharmacologic Agent - OS - Left Eye       Time Out 04/11/2020. 8:56 AM. Confirmed correct patient, procedure, site, and patient consented.    Anesthesia Anesthetic medications included Akten 3.5%.   Procedure Preparation included Ofloxacin . A 30 gauge needle was used.   Injection:  2.5 mg Bevacizumab (AVASTIN) 2.5mg /0.69mL SOSY   NDC: 29924-268-34   Route: Intravitreal, Site: Left Eye  Post-op Post injection exam found visual acuity of at least counting fingers. The patient tolerated the procedure well. There were no complications. The patient received written and verbal post procedure care education. Post injection medications were not given.                 ASSESSMENT/PLAN:  Exudative age-related macular degeneration of left eye with active choroidal neovascularization (HCC) OS with improved retinal findings, muchintraretinal or subretinal fluid remains.  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  Intraretinal fluid has completely resolved, return follow-up visit in 6 weeks  Serous detachment of retinal pigment epithelium of left eye Subfoveal PED, not related to the intraretinal fluid noted and imaged on June 2021, the subfoveal component of disease has not been associated with wet AMD      ICD-10-CM   1. Exudative age-related macular degeneration of left eye with active choroidal neovascularization (HCC)  H35.3221 OCT, Retina - OU - Both Eyes    Intravitreal Injection, Pharmacologic Agent - OS - Left Eye    bevacizumab (AVASTIN) SOSY 2.5 mg  2. Serous detachment of retinal pigment epithelium of left eye  H35.722     1.  2.  3.  Ophthalmic Meds Ordered this visit:  Meds ordered this encounter  Medications  . bevacizumab (AVASTIN) SOSY 2.5 mg       Return in about 6 weeks (around 05/23/2020) for dilate, OS, AVASTIN OCT.  There are no Patient Instructions on file for this visit.   Explained the diagnoses, plan, and follow up with the patient and they expressed  understanding.  Patient expressed understanding of the importance of proper follow up care.   Clent Demark Rankin M.D. Diseases & Surgery of the Retina and Vitreous Retina & Diabetic [place] 04/11/20     Abbreviations: M myopia (nearsighted); A astigmatism; H hyperopia (farsighted); P presbyopia; Mrx spectacle prescription;  CTL contact lenses; OD right eye; OS left eye; OU both eyes  XT exotropia; ET esotropia; PEK punctate epithelial keratitis; PEE punctate epithelial erosions; DES dry eye syndrome; MGD meibomian gland dysfunction; ATs artificial tears; PFAT's preservative free artificial tears; [place] nuclear sclerotic cataract; PSC posterior subcapsular cataract; ERM epi-retinal membrane; PVD posterior vitreous detachment; RD retinal detachment; DM diabetes mellitus; DR diabetic retinopathy; NPDR non-proliferative diabetic retinopathy; PDR proliferative diabetic retinopathy; CSME clinically significant macular edema; DME diabetic macular edema; dbh dot blot hemorrhages; CWS cotton wool spot; POAG primary open angle glaucoma; C/D cup-to-disc ratio; HVF humphrey visual field; GVF goldmann visual field; OCT optical coherence tomography; IOP intraocular pressure; BRVO Branch retinal  vein occlusion; CRVO central retinal vein occlusion; CRAO central retinal artery occlusion; BRAO branch retinal artery occlusion; RT retinal tear; SB scleral buckle; PPV pars plana vitrectomy; VH Vitreous hemorrhage; PRP panretinal laser photocoagulation; IVK intravitreal kenalog; VMT vitreomacular traction; MH Macular hole;  NVD neovascularization of the disc; NVE neovascularization elsewhere; AREDS age related eye disease study; ARMD age related macular degeneration; POAG primary open angle glaucoma; EBMD epithelial/anterior basement membrane dystrophy; ACIOL anterior chamber intraocular lens; IOL intraocular lens; PCIOL posterior chamber intraocular lens; Phaco/IOL phacoemulsification with intraocular lens placement; [place]  photorefractive keratectomy; LASIK laser assisted in situ keratomileusis; HTN hypertension; DM diabetes mellitus; COPD chronic obstructive pulmonary disease

## 2020-04-13 ENCOUNTER — Encounter (INDEPENDENT_AMBULATORY_CARE_PROVIDER_SITE_OTHER): Payer: 59 | Admitting: Ophthalmology

## 2020-05-04 MED FILL — ROCKLATAN 0.02-0.005 % SOLN: 0.02-0.005 | 25 days supply | Qty: 3 | Fill #7

## 2020-05-04 MED FILL — CITALOPRAM HBR 40 MG TABLET: 40 | 90 days supply | Qty: 90 | Fill #3

## 2020-05-04 MED FILL — DORZOLAMIDE-TIMOLOL EYE DRP: 22.3-6.8 | 37 days supply | Qty: 10 | Fill #1

## 2020-05-06 DIAGNOSIS — G4733 Obstructive sleep apnea (adult) (pediatric): Secondary | ICD-10-CM | POA: Diagnosis not present

## 2020-05-17 ENCOUNTER — Other Ambulatory Visit: Payer: Self-pay

## 2020-05-17 ENCOUNTER — Ambulatory Visit (INDEPENDENT_AMBULATORY_CARE_PROVIDER_SITE_OTHER): Payer: 59 | Admitting: Ophthalmology

## 2020-05-17 ENCOUNTER — Encounter (INDEPENDENT_AMBULATORY_CARE_PROVIDER_SITE_OTHER): Payer: Self-pay | Admitting: Ophthalmology

## 2020-05-17 DIAGNOSIS — H353221 Exudative age-related macular degeneration, left eye, with active choroidal neovascularization: Secondary | ICD-10-CM | POA: Diagnosis not present

## 2020-05-17 DIAGNOSIS — H35722 Serous detachment of retinal pigment epithelium, left eye: Secondary | ICD-10-CM

## 2020-05-17 MED ORDER — BEVACIZUMAB 2.5 MG/0.1ML IZ SOSY
2.5000 mg | PREFILLED_SYRINGE | INTRAVITREAL | Status: AC | PRN
  Administered 2020-05-17: 2.5 mg via INTRAVITREAL

## 2020-05-17 NOTE — Assessment & Plan Note (Signed)
Slight recurrence of subretinal fluid today inferior to the FAZ, at 6-week follow-up interval will repeat treatment today for likely some component of CNVM although this could be pure CS CR.  With previous history of intraretinal fluid and 60 CNVM activity however we will continue to treat as wet AMD

## 2020-05-17 NOTE — Assessment & Plan Note (Addendum)
Recent onset June 2021 with some resolution findings yet with small subfoveal pigment epithelial detachment as recent as April 11, 2020, now today with extension of subretinal fluid from the subfoveal pigment epithelial detachment.  Thus some increased activity of either central serous retinopathy or CNVM.  Fortunately there is no signs of intraretinal fluid, today at 6-week interval post Avastin repeat today and examination in 5 to 7 weeks next

## 2020-05-17 NOTE — Progress Notes (Signed)
05/17/2020     CHIEF COMPLAINT Patient presents for Retina Follow Up (5 Week Wet AMD f\u OS. Possible Avastin OS. OCT/Pt denies changes in vision. Does not have any complaints.)   HISTORY OF PRESENT ILLNESS: Nicholas Young is a 65 y.o. male who presents to the clinic today for:   HPI    Retina Follow Up    Patient presents with  Wet AMD.  In left eye.  Severity is moderate.  Duration of 5 weeks.  Since onset it is stable.  I, the attending physician,  performed the HPI with the patient and updated documentation appropriately. Additional comments: 5 Week Wet AMD f\u OS. Possible Avastin OS. OCT Pt denies changes in vision. Does not have any complaints.       Last edited by Tilda Franco on 05/17/2020  8:36 AM. (History)      Referring physician: Gaynelle Arabian, MD [address],  [place] [address]  HISTORICAL INFORMATION:   Selected notes from the MEDICAL RECORD NUMBER       CURRENT MEDICATIONS: Current Outpatient Medications (Ophthalmic Drugs)  Medication Sig  . Aflibercept (EYLEA IO) Inject into the eye.  . brimonidine-timolol (COMBIGAN) 0.2-0.5 % ophthalmic solution Place 1 drop into both eyes every 12 (twelve) hours. (Patient not taking: Reported on 03/02/2020)  . dorzolamide-timolol (COSOPT) 22.3-6.8 MG/ML ophthalmic solution 1 drop 2 (two) times daily.  . dorzolamide-timolol (COSOPT) 22.3-6.8 MG/ML ophthalmic solution INSTILL 1 DROP INTO BOTH EYES TWICE A DAY  . Netarsudil-Latanoprost (ROCKLATAN) 0.02-0.005 % SOLN Apply 1 drop to eye at bedtime. In both eyes.  . Netarsudil-Latanoprost 0.02-0.005 % SOLN INSTILL 1 DROP INTO BOTH EYES EVERY EVENING   No current facility-administered medications for this visit. (Ophthalmic Drugs)   Current Outpatient Medications (Other)  Medication Sig  . amoxicillin (AMOXIL) 500 MG capsule TAKE 2 CAPSULES BY MOUTH NOW THEN 1 CAPSULE 3 TIMES A DAY UNTIL GONE  . Budesonide (RHINOCORT ALLERGY NA) Place into the nose.   . citalopram (CELEXA) 40 MG tablet Take 40 mg by mouth daily.  . citalopram (CELEXA) 40 MG tablet TAKE 1 TABLET BY MOUTH ONCE DAILY.  Marland Kitchen glucosamine-chondroitin 500-400 MG tablet Take 1 tablet by mouth 3 (three) times daily.  . Multiple Vitamins-Minerals (PRESERVISION AREDS PO) Take by mouth.  . rosuvastatin (CRESTOR) 5 MG tablet TAKE 1 TABLET BY MOUTH ONCE DAILY FOR CHOLESTEROL.   No current facility-administered medications for this visit. (Other)      REVIEW OF SYSTEMS:    ALLERGIES No Known Allergies  PAST MEDICAL HISTORY Past Medical History:  Diagnosis Date  . Diabetes mellitus without complication ([place])   . Glaucoma   . Sleep apnea    Past Surgical History:  Procedure Laterality Date  . ANTERIOR CERVICAL DISCECTOMY    . KNEE ARTHROSCOPY      FAMILY HISTORY History reviewed. No pertinent family history.  SOCIAL HISTORY Social History   Tobacco Use  . Smoking status: Never Smoker  . Smokeless tobacco: Never Used  Substance Use Topics  . Alcohol use: Yes    Comment: 1-2 beers a week         OPHTHALMIC EXAM: Base Eye Exam    Visual Acuity (Snellen - Linear)      Right Left   Dist [place] 20/20 20/25 -2       Tonometry (Tonopen, 8:39 AM)      Right Left   Pressure 8 12       Pupils  Pupils Dark Light Shape React APD   Right PERRL 3 2 Round Brisk None   Left PERRL 3 2 Round Brisk None       Visual Fields (Counting fingers)      Left Right    Full Full       Neuro/Psych    Oriented x3: Yes   Mood/Affect: Normal       Dilation    Left eye: 1.0% Mydriacyl, 2.5% Phenylephrine @ 8:39 AM        Slit Lamp and Fundus Exam    External Exam      Right Left   External Normal Normal       Slit Lamp Exam      Right Left   Lids/Lashes Normal Normal   Conjunctiva/Sclera 1+ Injection White and quiet   Cornea Clear Clear   Anterior Chamber Deep and quiet Deep and quiet   Iris Round and reactive Round and reactive   Lens Posterior chamber  intraocular lens Posterior chamber intraocular lens   Anterior Vitreous Normal Normal       Fundus Exam      Right Left   Posterior Vitreous  Posterior vitreous detachment   Disc  Normal   C/D Ratio  0.2   Macula  Hard drusen, Retinal pigment epithelial mottling, Pigmented atrophy with RPE atrophy in the foveal and subfoveal location,, , no macular thickening, no hemorrhage   Vessels  Normal   Periphery  Normal          IMAGING AND PROCEDURES  Imaging and Procedures for 05/17/20  OCT, Retina - OU - Both Eyes       Right Eye Quality was good. Scan locations included subfoveal. Central Foveal Thickness: 299. Progression has been stable.   Left Eye Quality was good. Scan locations included subfoveal. Central Foveal Thickness: 316. Progression has been stable. Findings include pigment epithelial detachment.   Notes Intraretinal fluid and active CNVM noted June 2021 continues to be completely resolved, currently at 6-week interval.  Plan is to continue to June 2022, and will now extend this with new subretinal fluid from the foveal region extending inferiorly noted today's exam at 6-week follow-up interval post Avastin.  We will extend planned interval of therapy to another 6 months prior prior to any  attempt at lengthening interval examination       Intravitreal Injection, Pharmacologic Agent - OS - Left Eye       Time Out 05/17/2020. 9:15 AM. Confirmed correct patient, procedure, site, and patient consented.   Anesthesia Topical anesthesia was used. Anesthetic medications included Akten 3.5%.   Procedure Preparation included Ofloxacin , 5% betadine to ocular surface, 10% betadine to eyelids, Tobramycin 0.3%. A 30 gauge needle was used.   Injection:  2.5 mg Bevacizumab (AVASTIN) 2.5mg /0.74mL SOSY   NDC: 71696-789-38, Lot: [id number]   Route: Intravitreal, Site: Left Eye  Post-op Post injection exam found visual acuity of at least counting fingers. The patient tolerated  the procedure well. There were no complications. The patient received written and verbal post procedure care education. Post injection medications were not given.                 ASSESSMENT/PLAN:  Exudative age-related macular degeneration of left eye with active choroidal neovascularization [facility]) Recent onset June 2021 with some resolution findings yet with small subfoveal pigment epithelial detachment as recent as April 11, 2020, now today with extension of subretinal fluid from the subfoveal pigment epithelial detachment.  Thus some  increased activity of either central serous retinopathy or CNVM.  Fortunately there is no signs of intraretinal fluid, today at 6-week interval post Avastin repeat today and examination in 5 to 7 weeks next  Serous detachment of retinal pigment epithelium of left eye Slight recurrence of subretinal fluid today inferior to the FAZ, at 6-week follow-up interval will repeat treatment today for likely some component of CNVM although this could be pure CS CR.  With previous history of intraretinal fluid and 60 CNVM activity however we will continue to treat as wet AMD      ICD-10-CM   1. Exudative age-related macular degeneration of left eye with active choroidal neovascularization (HCC)  H35.3221 OCT, Retina - OU - Both Eyes    Intravitreal Injection, Pharmacologic Agent - OS - Left Eye    bevacizumab (AVASTIN) SOSY 2.5 mg  2. Serous detachment of retinal pigment epithelium of left eye  H35.722     1.  Persistent subretinal fluid left eye as a PED now with increased subretinal cyst serous nonturbid fluid collection, no signs of intraretinal fluid OS  2.  Repeat intravitreal Avastin OS today and examination again in 5 to 7 weeks with planned injection every 5 to 7 weeks for the next 6 months through the October 2022 time.  Based upon today's finding  3.  Ophthalmic Meds Ordered this visit:  Meds ordered this encounter  Medications  . bevacizumab (AVASTIN)  SOSY 2.5 mg       Return in about 6 weeks (around 06/28/2020) for dilate, OS, AVASTIN OCT.  There are no Patient Instructions on file for this visit.   Explained the diagnoses, plan, and follow up with the patient and they expressed understanding.  Patient expressed understanding of the importance of proper follow up care.   Clent Demark Cheyenne Bordeaux M.D. Diseases & Surgery of the Retina and Vitreous Retina & Diabetic [place] 05/17/20     Abbreviations: M myopia (nearsighted); A astigmatism; H hyperopia (farsighted); P presbyopia; Mrx spectacle prescription;  CTL contact lenses; OD right eye; OS left eye; OU both eyes  XT exotropia; ET esotropia; PEK punctate epithelial keratitis; PEE punctate epithelial erosions; DES dry eye syndrome; MGD meibomian gland dysfunction; ATs artificial tears; PFAT's preservative free artificial tears; [place] nuclear sclerotic cataract; PSC posterior subcapsular cataract; ERM epi-retinal membrane; PVD posterior vitreous detachment; RD retinal detachment; DM diabetes mellitus; DR diabetic retinopathy; NPDR non-proliferative diabetic retinopathy; PDR proliferative diabetic retinopathy; CSME clinically significant macular edema; DME diabetic macular edema; dbh dot blot hemorrhages; CWS cotton wool spot; POAG primary open angle glaucoma; C/D cup-to-disc ratio; HVF humphrey visual field; GVF goldmann visual field; OCT optical coherence tomography; IOP intraocular pressure; BRVO Branch retinal vein occlusion; CRVO central retinal vein occlusion; CRAO central retinal artery occlusion; BRAO branch retinal artery occlusion; RT retinal tear; SB scleral buckle; PPV pars plana vitrectomy; VH Vitreous hemorrhage; PRP panretinal laser photocoagulation; IVK intravitreal kenalog; VMT vitreomacular traction; MH Macular hole;  NVD neovascularization of the disc; NVE neovascularization elsewhere; AREDS age related eye disease study; ARMD age related macular degeneration; POAG primary open angle  glaucoma; EBMD epithelial/anterior basement membrane dystrophy; ACIOL anterior chamber intraocular lens; IOL intraocular lens; PCIOL posterior chamber intraocular lens; Phaco/IOL phacoemulsification with intraocular lens placement; Adams photorefractive keratectomy; LASIK laser assisted in situ keratomileusis; HTN hypertension; DM diabetes mellitus; COPD chronic obstructive pulmonary disease

## 2020-05-23 ENCOUNTER — Encounter (INDEPENDENT_AMBULATORY_CARE_PROVIDER_SITE_OTHER): Payer: 59 | Admitting: Ophthalmology

## 2020-05-24 ENCOUNTER — Ambulatory Visit (INDEPENDENT_AMBULATORY_CARE_PROVIDER_SITE_OTHER): Payer: 59 | Admitting: Orthopedic Surgery

## 2020-05-24 ENCOUNTER — Ambulatory Visit (INDEPENDENT_AMBULATORY_CARE_PROVIDER_SITE_OTHER): Payer: 59

## 2020-05-24 DIAGNOSIS — M25561 Pain in right knee: Secondary | ICD-10-CM

## 2020-05-27 ENCOUNTER — Encounter: Payer: Self-pay | Admitting: Orthopedic Surgery

## 2020-05-27 NOTE — Progress Notes (Signed)
Office Visit Note   Patient: Nicholas Young           Date of Birth: [birth year]           MRN: [id number] Visit Date: 05/24/2020 Requested by: Gaynelle Arabian, MD [address] [place],  [place] [address] PCP: Gaynelle Arabian, MD  Subjective: Chief Complaint  Patient presents with  . Right Knee - Pain    HPI: Nicholas Young is a 65 year old patient with right knee pain.  He works as a Immunologist at [employer].  Had twisting injury 1121.  This was a valgus noncontact injury with pop and swelling at the time when he was pulling an animal out of the woods.  Since that time he describes occasional swelling and instability but it has been persistent and frequent enough that he is seeking further management.  Denies any frank catching.  The pain will wake him from sleep at night.  He has a history of left knee arthroscopy in 1995 but subtotal meniscectomy.  Nonsteroidals have not given him much relief but he does take them on a daily basis.  Just finished with a robotics competition.  He has diet-controlled diabetes and sleep apnea as well as psoriasis.              ROS: All systems reviewed are negative as they relate to the chief complaint within the history of present illness.  Patient denies  fevers or chills.   Assessment & Plan: Visit Diagnoses:  1. Right knee pain, unspecified chronicity     Plan: Impression is right knee effusion and instability consistent with partial or complete ACL injury.  Mild joint space narrowing on the medial aspect of the right knee.  Symptoms have been ongoing for over 6 months with failure of conservative treatment.  He does report symptomatic instability of the knee as well as pain and effusion.  Plan MRI scan right knee to evaluate possible medial meniscal pathology and ACL tear.  Follow-up after that study.  Follow-Up Instructions: Return for after MRI.   Orders:  Orders Placed This Encounter  Procedures  . XR KNEE 3 VIEW RIGHT  . MR Knee Right w/o  contrast   No orders of the defined types were placed in this encounter.     Procedures: No procedures performed   Clinical Data: No additional findings.  Objective: Vital Signs: There were no vitals taken for this visit.  Physical Exam:  Constitutional: Patient appears well-developed HEENT:  Head: Normocephalic Eyes:EOM are normal Neck: Normal range of motion Cardiovascular: Normal rate Pulmonary/chest: Effort normal Neurologic: Patient is alert Skin: Skin is warm Psychiatric: Patient has normal mood and affect    Ortho Exam: Ortho exam demonstrates full range of motion of the right knee but mild effusion is present.  ACL laxity is present on the right compared to the left.  No posterior lateral rotatory instability is noted.  Mild medial joint line tenderness compared to the lateral side.  Extensor mechanism is intact.  Pedal pulses palpable.  Range of motion is full.  No groin pain with internal ex rotation of the right leg.  He does have a very mild psoriatic circular lesion in the region of possible medial arthroscopic portal.  Specialty Comments:  No specialty comments available.  Imaging: No results found.   PMFS History: Patient Active Problem List   Diagnosis Date Noted  . Serous detachment of retinal pigment epithelium of left eye 07/28/2019  . Early stage nonexudative age-related  macular degeneration of right eye 07/28/2019  . Exudative age-related macular degeneration of left eye with active choroidal neovascularization ([place]) 07/28/2019   Past Medical History:  Diagnosis Date  . Diabetes mellitus without complication ([place])   . Glaucoma   . Sleep apnea     History reviewed. No pertinent family history.  Past Surgical History:  Procedure Laterality Date  . ANTERIOR CERVICAL DISCECTOMY    . KNEE ARTHROSCOPY     Social History   Occupational History  . Not on file  Tobacco Use  . Smoking status: Never Smoker  . Smokeless tobacco: Never Used   Substance and Sexual Activity  . Alcohol use: Yes    Comment: 1-2 beers a week  . Drug use: Not on file  . Sexual activity: Not on file

## 2020-05-29 ENCOUNTER — Other Ambulatory Visit (HOSPITAL_COMMUNITY): Payer: Self-pay

## 2020-05-29 MED ORDER — FREESTYLE LANCETS MISC
Freq: Every day | 3 refills | Status: DC
  Filled 2020-05-29: qty 100, 90d supply, fill #0
  Filled 2020-11-20: qty 100, 90d supply, fill #1
  Filled 2021-05-10: qty 100, 90d supply, fill #2

## 2020-05-29 MED ORDER — GLUCOSE BLOOD VI STRP
ORAL_STRIP | 3 refills | Status: DC
  Filled 2020-05-29: qty 50, 50d supply, fill #0
  Filled 2020-08-21: qty 50, 50d supply, fill #1
  Filled 2020-11-20: qty 50, 50d supply, fill #2
  Filled 2021-05-10: qty 50, 50d supply, fill #3

## 2020-05-30 ENCOUNTER — Ambulatory Visit (INDEPENDENT_AMBULATORY_CARE_PROVIDER_SITE_OTHER): Payer: 59

## 2020-05-30 ENCOUNTER — Other Ambulatory Visit: Payer: Self-pay

## 2020-05-30 DIAGNOSIS — M7041 Prepatellar bursitis, right knee: Secondary | ICD-10-CM

## 2020-05-30 DIAGNOSIS — M25461 Effusion, right knee: Secondary | ICD-10-CM | POA: Diagnosis not present

## 2020-05-30 DIAGNOSIS — M25561 Pain in right knee: Secondary | ICD-10-CM | POA: Diagnosis not present

## 2020-05-30 DIAGNOSIS — S83241A Other tear of medial meniscus, current injury, right knee, initial encounter: Secondary | ICD-10-CM | POA: Diagnosis not present

## 2020-06-06 DIAGNOSIS — G4733 Obstructive sleep apnea (adult) (pediatric): Secondary | ICD-10-CM | POA: Diagnosis not present

## 2020-06-15 ENCOUNTER — Other Ambulatory Visit (HOSPITAL_COMMUNITY): Payer: Self-pay

## 2020-06-15 DIAGNOSIS — Z23 Encounter for immunization: Secondary | ICD-10-CM | POA: Diagnosis not present

## 2020-06-15 DIAGNOSIS — E119 Type 2 diabetes mellitus without complications: Secondary | ICD-10-CM | POA: Diagnosis not present

## 2020-06-15 DIAGNOSIS — H409 Unspecified glaucoma: Secondary | ICD-10-CM | POA: Diagnosis not present

## 2020-06-15 DIAGNOSIS — G4733 Obstructive sleep apnea (adult) (pediatric): Secondary | ICD-10-CM | POA: Diagnosis not present

## 2020-06-15 DIAGNOSIS — F322 Major depressive disorder, single episode, severe without psychotic features: Secondary | ICD-10-CM | POA: Diagnosis not present

## 2020-06-15 DIAGNOSIS — Z125 Encounter for screening for malignant neoplasm of prostate: Secondary | ICD-10-CM | POA: Diagnosis not present

## 2020-06-15 DIAGNOSIS — Z0000 Encounter for general adult medical examination without abnormal findings: Secondary | ICD-10-CM | POA: Diagnosis not present

## 2020-06-15 DIAGNOSIS — E785 Hyperlipidemia, unspecified: Secondary | ICD-10-CM | POA: Diagnosis not present

## 2020-06-15 MED ORDER — ROSUVASTATIN CALCIUM 5 MG PO TABS
5.0000 mg | ORAL_TABLET | Freq: Every day | ORAL | 3 refills | Status: AC
  Filled 2020-06-15 – 2020-06-21 (×2): qty 90, 90d supply, fill #0
  Filled 2020-10-02: qty 90, 90d supply, fill #1
  Filled 2021-04-09: qty 90, 90d supply, fill #2

## 2020-06-15 MED ORDER — CITALOPRAM HYDROBROMIDE 40 MG PO TABS
40.0000 mg | ORAL_TABLET | Freq: Every day | ORAL | 3 refills | Status: DC
  Filled 2020-06-15 – 2020-07-27 (×2): qty 90, 90d supply, fill #0
  Filled 2020-10-31: qty 90, 90d supply, fill #1
  Filled 2021-04-09: qty 90, 90d supply, fill #2

## 2020-06-21 ENCOUNTER — Other Ambulatory Visit (HOSPITAL_COMMUNITY): Payer: Self-pay

## 2020-06-21 ENCOUNTER — Other Ambulatory Visit (HOSPITAL_COMMUNITY): Payer: Self-pay | Admitting: Ophthalmology

## 2020-06-21 MED FILL — Dorzolamide HCl-Timolol Maleate Ophth Soln 2-0.5%: OPHTHALMIC | 100 days supply | Qty: 10 | Fill #0 | Status: CN

## 2020-06-21 MED FILL — Netarsudil Dimesylate-Latanoprost Ophth Soln 0.02-0.005%: OPHTHALMIC | 25 days supply | Qty: 2.5 | Fill #0 | Status: AC

## 2020-06-22 ENCOUNTER — Other Ambulatory Visit (HOSPITAL_COMMUNITY): Payer: Self-pay

## 2020-06-23 ENCOUNTER — Other Ambulatory Visit (HOSPITAL_COMMUNITY): Payer: Self-pay

## 2020-06-23 MED ORDER — DORZOLAMIDE HCL-TIMOLOL MAL 2-0.5 % OP SOLN
OPHTHALMIC | 3 refills | Status: AC
  Filled 2020-06-23: qty 10, 50d supply, fill #0
  Filled 2020-08-21: qty 10, 50d supply, fill #1
  Filled 2020-10-17: qty 10, 50d supply, fill #2

## 2020-06-24 ENCOUNTER — Other Ambulatory Visit (HOSPITAL_COMMUNITY): Payer: Self-pay

## 2020-06-27 ENCOUNTER — Other Ambulatory Visit (HOSPITAL_COMMUNITY): Payer: Self-pay

## 2020-06-30 ENCOUNTER — Encounter: Payer: Self-pay | Admitting: Orthopedic Surgery

## 2020-06-30 ENCOUNTER — Other Ambulatory Visit: Payer: Self-pay

## 2020-06-30 ENCOUNTER — Ambulatory Visit (INDEPENDENT_AMBULATORY_CARE_PROVIDER_SITE_OTHER): Payer: 59 | Admitting: Orthopedic Surgery

## 2020-06-30 VITALS — Ht 71.0 in | Wt 280.0 lb

## 2020-06-30 DIAGNOSIS — M25561 Pain in right knee: Secondary | ICD-10-CM

## 2020-06-30 DIAGNOSIS — S83231D Complex tear of medial meniscus, current injury, right knee, subsequent encounter: Secondary | ICD-10-CM

## 2020-07-01 ENCOUNTER — Encounter: Payer: Self-pay | Admitting: Orthopedic Surgery

## 2020-07-01 DIAGNOSIS — S83231D Complex tear of medial meniscus, current injury, right knee, subsequent encounter: Secondary | ICD-10-CM

## 2020-07-01 DIAGNOSIS — M25561 Pain in right knee: Secondary | ICD-10-CM | POA: Diagnosis not present

## 2020-07-01 MED ORDER — LIDOCAINE HCL 1 % IJ SOLN
5.0000 mL | INTRAMUSCULAR | Status: AC | PRN
  Administered 2020-07-01: 5 mL

## 2020-07-01 MED ORDER — BUPIVACAINE HCL 0.25 % IJ SOLN
4.0000 mL | INTRAMUSCULAR | Status: AC | PRN
  Administered 2020-07-01: 4 mL via INTRA_ARTICULAR

## 2020-07-01 NOTE — Progress Notes (Signed)
Office Visit Note   Patient: Nicholas Young           Date of Birth: August 19, 1955           MRN: [id number] Visit Date: 06/30/2020 Requested by: Gaynelle Arabian, MD [address] [place],  [place] [address] PCP: Gaynelle Arabian, MD  Subjective: Chief Complaint  Patient presents with  . Right Knee - Follow-up    MRI review    HPI: Nicholas Young is a 65 year old patient with right knee pain.  MRI scan performed 05/30/2020.  His scan is reviewed today.  In general he still reports swelling in the knee but denies much in the way of mechanical symptoms causing his knee to lock up.  Taking naproxen twice a day.  Naproxen keeps his knee from swelling more.  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  Planning to move to [place] to do skiing and hiking after retirement in approximately 1 to 2 years.              ROS: All systems reviewed are negative as they relate to the chief complaint within the history of present illness.  Patient denies  fevers or chills.   Assessment & Plan: Visit Diagnoses:  1. Right knee pain, unspecified chronicity   2. Complex tear of medial meniscus of right knee as current injury, subsequent encounter     Plan: Impression is right knee pain with some partial-thickness cartilage loss in the medial femoral condyle as well as complex tear of the medial meniscus which does not really sound like it is clinically unstable at this time.  Plan is to aspirate and inject the knee to see if we can calm down the inflammation.  Could consider arthroscopic debridement if that fails.  He and I work together so he will keep me informed of his progress at work.  Follow-up as needed.  Follow-Up Instructions: Return if symptoms worsen or fail to improve.   Orders:  No orders of the defined types were placed in this encounter.  No orders of the defined types were placed in this encounter.     Procedures: Large Joint Inj: R knee on 07/01/2020 12:26 PM Indications:  diagnostic evaluation, joint swelling and pain Details: 18 G 1.5 in needle, superolateral approach  Arthrogram: No  Medications: 5 mL lidocaine 1 %; 4 mL bupivacaine 0.25 % Outcome: tolerated well, no immediate complications Procedure, treatment alternatives, risks and benefits explained, specific risks discussed. Consent was given by the patient. Immediately prior to procedure a time out was called to verify the correct patient, procedure, equipment, support staff and site/side marked as required. Patient was prepped and draped in the usual sterile fashion.       Clinical Data: No additional findings.  Objective: Vital Signs: Ht 5\' 11"  (1.803 m)   Wt 280 lb (127 kg)   BMI 39.05 kg/m   Physical Exam:   Constitutional: Patient appears well-developed HEENT:  Head: Normocephalic Eyes:EOM are normal Neck: Normal range of motion Cardiovascular: Normal rate Pulmonary/chest: Effort normal Neurologic: Patient is alert Skin: Skin is warm Psychiatric: Patient has normal mood and affect    Ortho Exam: Ortho exam demonstrates full active and passive range of motion right knee with mild effusion.  Collateral crucial ligaments are stable.  Bilateral patellofemoral crepitus is present.  Has generally global knee pain with slight preference on the medial side compared to the lateral side.  No groin pain or restricted motion of the right  hip is present.  Specialty Comments:  No specialty comments available.  Imaging: No results found.   PMFS History: Patient Active Problem List   Diagnosis Date Noted  . Serous detachment of retinal pigment epithelium of left eye 07/28/2019  . Early stage nonexudative age-related macular degeneration of right eye 07/28/2019  . Exudative age-related macular degeneration of left eye with active choroidal neovascularization ([place]) 07/28/2019   Past Medical History:  Diagnosis Date  . Diabetes mellitus without complication ([place])   . Glaucoma   .  Sleep apnea     History reviewed. No pertinent family history.  Past Surgical History:  Procedure Laterality Date  . ANTERIOR CERVICAL DISCECTOMY    . KNEE ARTHROSCOPY     Social History   Occupational History  . Not on file  Tobacco Use  . Smoking status: Never Smoker  . Smokeless tobacco: Never Used  Substance and Sexual Activity  . Alcohol use: Yes    Comment: 1-2 beers a week  . Drug use: Not on file  . Sexual activity: Not on file

## 2020-07-06 ENCOUNTER — Other Ambulatory Visit: Payer: Self-pay

## 2020-07-06 ENCOUNTER — Encounter (INDEPENDENT_AMBULATORY_CARE_PROVIDER_SITE_OTHER): Payer: 59 | Admitting: Ophthalmology

## 2020-07-06 ENCOUNTER — Encounter (INDEPENDENT_AMBULATORY_CARE_PROVIDER_SITE_OTHER): Payer: Self-pay | Admitting: Ophthalmology

## 2020-07-06 ENCOUNTER — Ambulatory Visit (INDEPENDENT_AMBULATORY_CARE_PROVIDER_SITE_OTHER): Payer: 59 | Admitting: Ophthalmology

## 2020-07-06 DIAGNOSIS — H353221 Exudative age-related macular degeneration, left eye, with active choroidal neovascularization: Secondary | ICD-10-CM | POA: Diagnosis not present

## 2020-07-06 DIAGNOSIS — H35722 Serous detachment of retinal pigment epithelium, left eye: Secondary | ICD-10-CM | POA: Diagnosis not present

## 2020-07-06 DIAGNOSIS — G4733 Obstructive sleep apnea (adult) (pediatric): Secondary | ICD-10-CM | POA: Diagnosis not present

## 2020-07-06 DIAGNOSIS — H353111 Nonexudative age-related macular degeneration, right eye, early dry stage: Secondary | ICD-10-CM

## 2020-07-06 MED ORDER — BEVACIZUMAB 2.5 MG/0.1ML IZ SOSY
2.5000 mg | PREFILLED_SYRINGE | INTRAVITREAL | Status: AC | PRN
  Administered 2020-07-06: 2.5 mg via INTRAVITREAL

## 2020-07-06 NOTE — Assessment & Plan Note (Signed)
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

## 2020-07-06 NOTE — Progress Notes (Signed)
07/06/2020     CHIEF COMPLAINT Patient presents for Retina Follow Up (6 week fu OS and Avastin OS/Pt states [facility] OU stable since last visit. Pt denies FOL, floaters, or ocular pain OU. /Pt reports taking Cosopt BID OU and Rocklatan QHS OU /A1C: 6.4/LBS: 122)   HISTORY OF PRESENT ILLNESS: Nicholas Young is a 65 y.o. male who presents to the clinic today for:   HPI    Retina Follow Up    Diagnosis: Wet AMD   Laterality: left eye   Onset: 6 weeks ago   Severity: mild   Duration: 6 weeks   Course: stable   Comments: 6 week fu OS and Avastin OS Pt states VA OU stable since last visit. Pt denies FOL, floaters, or ocular pain OU.  Pt reports taking Cosopt BID OU and Rocklatan QHS OU  A1C: 6.4 LBS: 122       Last edited by Kendra Opitz, COA on 07/06/2020  3:21 PM. (History)      Referring physician: Gaynelle Arabian, MD [address],  [place] [address]  HISTORICAL INFORMATION:   Selected notes from the MEDICAL RECORD NUMBER       CURRENT MEDICATIONS: Current Outpatient Medications (Ophthalmic Drugs)  Medication Sig  . dorzolamide-timolol (COSOPT) 22.3-6.8 MG/ML ophthalmic solution INSTILL 1 DROP INTO BOTH EYES TWICE A DAY  . dorzolamide-timolol (COSOPT) 22.3-6.8 MG/ML ophthalmic solution Instill 1 drop into both eyes twice a day as directed  . Netarsudil-Latanoprost 0.02-0.005 % SOLN INSTILL 1 DROP INTO BOTH EYES EVERY EVENING   No current facility-administered medications for this visit. (Ophthalmic Drugs)   Current Outpatient Medications (Other)  Medication Sig  . Budesonide (RHINOCORT ALLERGY NA) Place into the nose.  . citalopram (CELEXA) 40 MG tablet Take 40 mg by mouth daily.  . citalopram (CELEXA) 40 MG tablet TAKE 1 TABLET BY MOUTH ONCE DAILY.  . citalopram (CELEXA) 40 MG tablet Take 1 tablet (40 mg total) by mouth daily.  Marland Kitchen glucosamine-chondroitin 500-400 MG tablet Take 1 tablet by mouth 3 (three) times daily.  Marland Kitchen glucose blood test strip  Use as directed to check blood sugar once daily  . Lancets (FREESTYLE) lancets use as directed to check blood sugar daily  . Multiple Vitamins-Minerals (PRESERVISION AREDS PO) Take by mouth.  . rosuvastatin (CRESTOR) 5 MG tablet TAKE 1 TABLET BY MOUTH ONCE DAILY FOR CHOLESTEROL.  Marland Kitchen rosuvastatin (CRESTOR) 5 MG tablet Take 1 tablet (5 mg total) by mouth daily for cholesterol.   No current facility-administered medications for this visit. (Other)      REVIEW OF SYSTEMS:    ALLERGIES No Known Allergies  PAST MEDICAL HISTORY Past Medical History:  Diagnosis Date  . Diabetes mellitus without complication ([place])   . Glaucoma   . Sleep apnea    Past Surgical History:  Procedure Laterality Date  . ANTERIOR CERVICAL DISCECTOMY    . KNEE ARTHROSCOPY      FAMILY HISTORY History reviewed. No pertinent family history.  SOCIAL HISTORY Social History   Tobacco Use  . Smoking status: Never Smoker  . Smokeless tobacco: Never Used  Substance Use Topics  . Alcohol use: Yes    Comment: 1-2 beers a week         OPHTHALMIC EXAM:  Base Eye Exam    Visual Acuity (ETDRS)      Right Left   Dist [place] 20/25 -1 20/30 -1   Dist ph [place]  20/25  Tonometry (Tonopen, 3:22 PM)      Right Left   Pressure 14 16       Pupils      Pupils Dark Light Shape React APD   Right PERRL 3 2 Round Brisk None   Left PERRL 3 2 Round Brisk None       Visual Fields (Counting fingers)      Left Right    Full Full       Extraocular Movement      Right Left    Full Full       Neuro/Psych    Oriented x3: Yes   Mood/Affect: Normal       Dilation    Left eye: 1.0% Mydriacyl, 2.5% Phenylephrine @ 3:22 PM        Slit Lamp and Fundus Exam    External Exam      Right Left   External Normal Normal       Slit Lamp Exam      Right Left   Lids/Lashes Normal Normal   Conjunctiva/Sclera 1+ Injection White and quiet   Cornea Clear Clear   Anterior Chamber Deep and quiet Deep and quiet    Iris Round and reactive Round and reactive   Lens Posterior chamber intraocular lens Posterior chamber intraocular lens   Anterior Vitreous Normal Normal       Fundus Exam      Right Left   Posterior Vitreous  Posterior vitreous detachment   Disc  Normal   C/D Ratio  0.2   Macula  Hard drusen, Retinal pigment epithelial mottling, Pigmented atrophy with RPE atrophy in the foveal and subfoveal location,, , no macular thickening, no hemorrhage   Vessels  Normal   Periphery  Normal          IMAGING AND PROCEDURES  Imaging and Procedures for 07/06/20  OCT, Retina - OU - Both Eyes       Right Eye Quality was good. Scan locations included subfoveal. Central Foveal Thickness: 304. Progression has been stable. Findings include retinal drusen .   Left Eye Quality was good. Scan locations included subfoveal. Central Foveal Thickness: 305. Progression has improved. Findings include pigment epithelial detachment, abnormal foveal contour, retinal drusen .   Notes OS with subretinal fluid in the foveal location, yet much less subretinal fluid inferior to the fovea improved at this 7-week interval.  Post Avastin.  We will repeat injection again today OS  OD stable       Intravitreal Injection, Pharmacologic Agent - OS - Left Eye       Time Out 07/06/2020. 3:58 PM. Confirmed correct patient, procedure, site, and patient consented.   Anesthesia Topical anesthesia was used. Anesthetic medications included Akten 3.5%.   Procedure Preparation included Ofloxacin , 5% betadine to ocular surface, 10% betadine to eyelids, Tobramycin 0.3%. A 30 gauge needle was used.   Injection:  2.5 mg Bevacizumab (AVASTIN) 2.5mg /0.60mL SOSY   NDC: 85885-027-74, Lot: [id number]   Route: Intravitreal, Site: Left Eye  Post-op Post injection exam found visual acuity of at least counting fingers. The patient tolerated the procedure well. There were no complications. The patient received written and verbal  post procedure care education. Post injection medications were not given.                 ASSESSMENT/PLAN:  Exudative age-related macular degeneration of left eye with active choroidal neovascularization (HCC) Much less subretinal fluid now inferior to the fovea with persistence of sub-RPE fluid  in a foveal location.  Currently at 7-week follow-up interval post Avastin injection.  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  Early stage nonexudative age-related macular degeneration of right eye No signs of advanced AMD OD      ICD-10-CM   1. Exudative age-related macular degeneration of left eye with active choroidal neovascularization (HCC)  H35.3221 OCT, Retina - OU - Both Eyes    Intravitreal Injection, Pharmacologic Agent - OS - Left Eye    bevacizumab (AVASTIN) SOSY 2.5 mg  2. Serous detachment of retinal pigment epithelium of left eye  H35.722   3. Early stage nonexudative age-related macular degeneration of right eye  H35.3111     1.  Improved macular anatomy OS post Avastin currently on 7-week follow-up.  Repeat injection in 6 to 7 weeks depending on the patient  2.  3.  Ophthalmic Meds Ordered this visit:  Meds ordered this encounter  Medications  . bevacizumab (AVASTIN) SOSY 2.5 mg       Return in about 6 weeks (around 08/17/2020) for dilate, OS, AVASTIN OCT.  There are no Patient Instructions on file for this visit.   Explained the diagnoses, plan, and follow up with the patient and they expressed understanding.  Patient expressed understanding of the importance of proper follow up care.   Clent Demark Giovanna Kemmerer M.D. Diseases & Surgery of the Retina  and Vitreous Retina & Diabetic [place] 07/06/20     Abbreviations: M myopia (nearsighted); A astigmatism; H hyperopia (farsighted); P presbyopia; Mrx spectacle prescription;  CTL contact lenses; OD right eye; OS left eye; OU both eyes  XT exotropia; ET esotropia; PEK punctate epithelial keratitis; PEE punctate epithelial erosions; DES dry eye syndrome; MGD meibomian gland dysfunction; ATs artificial tears; PFAT's preservative free artificial tears; [place] nuclear sclerotic cataract; PSC posterior subcapsular cataract; ERM epi-retinal membrane; PVD posterior vitreous detachment; RD retinal detachment; DM diabetes mellitus; DR diabetic retinopathy; NPDR non-proliferative diabetic retinopathy; PDR proliferative diabetic retinopathy; CSME clinically significant macular edema; DME diabetic macular edema; dbh dot blot hemorrhages; CWS cotton wool spot; POAG primary open angle glaucoma; C/D cup-to-disc ratio; HVF humphrey visual field; GVF goldmann visual field; OCT optical coherence tomography; IOP intraocular pressure; BRVO Branch retinal vein occlusion; CRVO central retinal vein occlusion; CRAO central retinal artery occlusion; BRAO branch retinal artery occlusion; RT retinal tear; SB scleral buckle; PPV pars plana vitrectomy; VH Vitreous hemorrhage; PRP panretinal laser photocoagulation; IVK intravitreal kenalog; VMT vitreomacular traction; MH Macular hole;  NVD neovascularization of the disc; NVE neovascularization elsewhere; AREDS age related eye disease study; ARMD age related macular degeneration; POAG primary open angle glaucoma; EBMD epithelial/anterior basement membrane dystrophy; ACIOL anterior chamber intraocular lens; IOL intraocular lens; PCIOL posterior chamber intraocular lens; Phaco/IOL phacoemulsification with intraocular lens placement; [place] photorefractive keratectomy; LASIK laser assisted in situ keratomileusis; HTN hypertension; DM diabetes mellitus; COPD chronic obstructive pulmonary  disease

## 2020-07-06 NOTE — Assessment & Plan Note (Signed)
No signs of advanced AMD OD

## 2020-07-06 NOTE — Assessment & Plan Note (Signed)
Much less subretinal fluid now inferior to the fovea with persistence of sub-RPE fluid in a foveal location.  Currently at 7-week follow-up interval post Avastin injection.  Please note most recent onset of recurrence was June 2021  Repeat injection intravitreal Avastin today and maintain 6 to 7-week follow-up interval

## 2020-07-27 ENCOUNTER — Other Ambulatory Visit (HOSPITAL_COMMUNITY): Payer: Self-pay

## 2020-07-27 MED FILL — Netarsudil Dimesylate-Latanoprost Ophth Soln 0.02-0.005%: OPHTHALMIC | 25 days supply | Qty: 2.5 | Fill #1 | Status: AC

## 2020-08-06 DIAGNOSIS — G4733 Obstructive sleep apnea (adult) (pediatric): Secondary | ICD-10-CM | POA: Diagnosis not present

## 2020-08-17 ENCOUNTER — Encounter (INDEPENDENT_AMBULATORY_CARE_PROVIDER_SITE_OTHER): Payer: 59 | Admitting: Ophthalmology

## 2020-08-21 ENCOUNTER — Other Ambulatory Visit (HOSPITAL_COMMUNITY): Payer: Self-pay

## 2020-08-21 MED FILL — Netarsudil Dimesylate-Latanoprost Ophth Soln 0.02-0.005%: OPHTHALMIC | 25 days supply | Qty: 2.5 | Fill #2 | Status: AC

## 2020-08-22 ENCOUNTER — Other Ambulatory Visit (HOSPITAL_COMMUNITY): Payer: Self-pay

## 2020-08-23 ENCOUNTER — Encounter (INDEPENDENT_AMBULATORY_CARE_PROVIDER_SITE_OTHER): Payer: Self-pay | Admitting: Ophthalmology

## 2020-08-23 ENCOUNTER — Ambulatory Visit (INDEPENDENT_AMBULATORY_CARE_PROVIDER_SITE_OTHER): Payer: 59 | Admitting: Ophthalmology

## 2020-08-23 ENCOUNTER — Other Ambulatory Visit: Payer: Self-pay

## 2020-08-23 DIAGNOSIS — H353221 Exudative age-related macular degeneration, left eye, with active choroidal neovascularization: Secondary | ICD-10-CM | POA: Diagnosis not present

## 2020-08-23 DIAGNOSIS — H401131 Primary open-angle glaucoma, bilateral, mild stage: Secondary | ICD-10-CM | POA: Diagnosis not present

## 2020-08-23 MED ORDER — BEVACIZUMAB 2.5 MG/0.1ML IZ SOSY
2.5000 mg | PREFILLED_SYRINGE | INTRAVITREAL | Status: AC | PRN
  Administered 2020-08-23: 2.5 mg via INTRAVITREAL

## 2020-08-23 NOTE — Assessment & Plan Note (Signed)
OS with chronic recurrence of sub-RPE and occasional subretinal fluid.  This could be CS CR.  There may be some role of RPE detachment also from vitreomacular adhesion as there is no obvious PVD yet formed in this left eye which is pseudophakic  Nonetheless at 7-week interval there is no interval change.  There is no intrusion into the retina.  There is no intraretinal fluid nor macular edema.  We will repeat Avastin today for safety purposes in this age group but also extend the interval follow-up to 9 weeks.

## 2020-08-23 NOTE — Progress Notes (Signed)
08/23/2020     CHIEF COMPLAINT Patient presents for Retina Follow Up (6 week fu OS and Avastin OS/Pt states VA OU stable since last visit. Pt denies FOL, floaters, or ocular pain OU. [name]: 6.4/LBS:112/Pt reports using Cosopt BID OU and Rocklatan QHS OU)   HISTORY OF PRESENT ILLNESS: Nicholas Young is a 65 y.o. male who presents to the clinic today for:   HPI     Retina Follow Up           Diagnosis: Wet AMD   Laterality: left eye   Onset: 6 weeks ago   Severity: mild   Duration: 6 weeks   Course: stable   Comments: 6 week fu OS and Avastin OS Pt states VA OU stable since last visit. Pt denies FOL, floaters, or ocular pain OU.  A1C: 6.4 LBS:112 Pt reports using Cosopt BID OU and Rocklatan QHS OU       Last edited by Nicholas Young, COA on 08/23/2020  8:09 AM.      Referring physician: Gaynelle Arabian, MD [address],  [place] [address]  HISTORICAL INFORMATION:   Selected notes from the MEDICAL RECORD NUMBER       CURRENT MEDICATIONS: Current Outpatient Medications (Ophthalmic Drugs)  Medication Sig   dorzolamide-timolol (COSOPT) 22.3-6.8 MG/ML ophthalmic solution INSTILL 1 DROP INTO BOTH EYES TWICE A DAY   dorzolamide-timolol (COSOPT) 22.3-6.8 MG/ML ophthalmic solution Instill 1 drop into both eyes twice a day as directed   Netarsudil-Latanoprost 0.02-0.005 % SOLN INSTILL 1 DROP INTO BOTH EYES EVERY EVENING   No current facility-administered medications for this visit. (Ophthalmic Drugs)   Current Outpatient Medications (Other)  Medication Sig   Budesonide (RHINOCORT ALLERGY NA) Place into the nose.   citalopram (CELEXA) 40 MG tablet Take 40 mg by mouth daily.   citalopram (CELEXA) 40 MG tablet TAKE 1 TABLET BY MOUTH ONCE DAILY.   citalopram (CELEXA) 40 MG tablet Take 1 tablet (40 mg total) by mouth daily.   glucosamine-chondroitin 500-400 MG tablet Take 1 tablet by mouth 3 (three) times daily.   glucose blood test strip Use as directed  to check blood sugar once daily   Lancets (FREESTYLE) lancets use as directed to check blood sugar daily   Multiple Vitamins-Minerals (PRESERVISION AREDS PO) Take by mouth.   rosuvastatin (CRESTOR) 5 MG tablet TAKE 1 TABLET BY MOUTH ONCE DAILY FOR CHOLESTEROL.   rosuvastatin (CRESTOR) 5 MG tablet Take 1 tablet (5 mg total) by mouth daily for cholesterol.   No current facility-administered medications for this visit. (Other)      REVIEW OF SYSTEMS:    ALLERGIES No Known Allergies  PAST MEDICAL HISTORY Past Medical History:  Diagnosis Date   Diabetes mellitus without complication ([place])    Glaucoma    Sleep apnea    Past Surgical History:  Procedure Laterality Date   ANTERIOR CERVICAL DISCECTOMY     KNEE ARTHROSCOPY      FAMILY HISTORY History reviewed. No pertinent family history.  SOCIAL HISTORY Social History   Tobacco Use   Smoking status: Never   Smokeless tobacco: Never  Substance Use Topics   Alcohol use: Yes    Comment: 1-2 beers a week         OPHTHALMIC EXAM:  Base Eye Exam     Visual Acuity (ETDRS)       Right Left   Dist [place] 20/30 -1 20/40   Dist ph [place] 20/20 20/25 +2  Tonometry (Tonopen, 8:13 AM)       Right Left   Pressure 10 11         Pupils       Pupils Dark Light Shape React APD   Right PERRL 3 2 Round Brisk None   Left PERRL 3 2 Round Brisk None         Visual Fields (Counting fingers)       Left Right    Full Full         Extraocular Movement       Right Left    Full Full         Neuro/Psych     Oriented x3: Yes   Mood/Affect: Normal         Dilation     Left eye: 1.0% Mydriacyl, 2.5% Phenylephrine @ 8:13 AM           Slit Lamp and Fundus Exam     External Exam       Right Left   External Normal Normal         Slit Lamp Exam       Right Left   Lids/Lashes Normal Normal   Conjunctiva/Sclera 1+ Injection White and quiet   Cornea Clear Clear   Anterior Chamber Deep and  quiet Deep and quiet   Iris Round and reactive Round and reactive   Lens Posterior chamber intraocular lens Posterior chamber intraocular lens   Anterior Vitreous Normal Normal         Fundus Exam       Right Left   Posterior Vitreous  Posterior vitreous detachment   Disc  Normal   C/D Ratio  0.2   Macula  Hard drusen, Retinal pigment epithelial mottling, Pigmented atrophy with RPE atrophy in the foveal and subfoveal location,, , no macular thickening, no hemorrhage   Vessels  Normal   Periphery  Normal            IMAGING AND PROCEDURES  Imaging and Procedures for 08/23/20  OCT, Retina - OU - Both Eyes       Right Eye Quality was good. Scan locations included subfoveal. Central Foveal Thickness: 299. Progression has been stable. Findings include retinal drusen .   Left Eye Quality was good. Scan locations included subfoveal. Central Foveal Thickness: 307. Progression has improved. Findings include pigment epithelial detachment, abnormal foveal contour, retinal drusen .   Notes OS with sub RPe and subretinal fluid in the foveal location, yet much less subretinal fluid inferior to the fovea improved at this 7-week interval.  Post Avastin.  We will repeat injection again today OS  OD stable     Intravitreal Injection, Pharmacologic Agent - OS - Left Eye       Time Out 08/23/2020. 8:39 AM. Confirmed correct patient, procedure, site, and patient consented.   Anesthesia Topical anesthesia was used. Anesthetic medications included Akten 3.5%.   Procedure Preparation included Ofloxacin , 5% betadine to ocular surface, 10% betadine to eyelids, Tobramycin 0.3%. A 30 gauge needle was used.   Injection: 2.5 mg bevacizumab 2.5 MG/0.1ML   Route: Intravitreal, Site: Left Eye   NDC: [phone]   Post-op Post injection exam found visual acuity of at least counting fingers. The patient tolerated the procedure well. There were no complications. The patient received written  and verbal post procedure care education. Post injection medications were not given.              ASSESSMENT/PLAN:  Exudative age-related  macular degeneration of left eye with active choroidal neovascularization (HCC) OS with chronic recurrence of sub-RPE and occasional subretinal fluid.  This could be CS CR.  There may be some role of RPE detachment also from vitreomacular adhesion as there is no obvious PVD yet formed in this left eye which is pseudophakic  Nonetheless at 7-week interval there is no interval change.  There is no intrusion into the retina.  There is no intraretinal fluid nor macular edema.  We will repeat Avastin today for safety purposes in this age group but also extend the interval follow-up to 9 weeks.      ICD-10-CM   1. Exudative age-related macular degeneration of left eye with active choroidal neovascularization (HCC)  H35.3221 OCT, Retina - OU - Both Eyes    Intravitreal Injection, Pharmacologic Agent - OS - Left Eye    bevacizumab (AVASTIN) SOSY 2.5 mg      1.  Repeat intravitreal Avastin OS today.  2.  Intraocular pressures remained stable each eye.  3.  Stent interval examination OS next to 9 weeks  Ophthalmic Meds Ordered this visit:  Meds ordered this encounter  Medications   bevacizumab (AVASTIN) SOSY 2.5 mg       Return in about 9 weeks (around 10/25/2020) for dilate, OS, AVASTIN OCT.  There are no Patient Instructions on file for this visit.   Explained the diagnoses, plan, and follow up with the patient and they expressed understanding.  Patient expressed understanding of the importance of proper follow up care.   Clent Demark Keeven Matty M.D. Diseases & Surgery of the Retina and Vitreous Retina & Diabetic [place] 08/23/20     Abbreviations: M myopia (nearsighted); A astigmatism; H hyperopia (farsighted); P presbyopia; Mrx spectacle prescription;  CTL contact lenses; OD right eye; OS left eye; OU both eyes  XT exotropia; ET  esotropia; PEK punctate epithelial keratitis; PEE punctate epithelial erosions; DES dry eye syndrome; MGD meibomian gland dysfunction; ATs artificial tears; PFAT's preservative free artificial tears; [place] nuclear sclerotic cataract; PSC posterior subcapsular cataract; ERM epi-retinal membrane; PVD posterior vitreous detachment; RD retinal detachment; DM diabetes mellitus; DR diabetic retinopathy; NPDR non-proliferative diabetic retinopathy; PDR proliferative diabetic retinopathy; CSME clinically significant macular edema; DME diabetic macular edema; dbh dot blot hemorrhages; CWS cotton wool spot; POAG primary open angle glaucoma; C/D cup-to-disc ratio; HVF humphrey visual field; GVF goldmann visual field; OCT optical coherence tomography; IOP intraocular pressure; BRVO Branch retinal vein occlusion; CRVO central retinal vein occlusion; CRAO central retinal artery occlusion; BRAO branch retinal artery occlusion; RT retinal tear; SB scleral buckle; PPV pars plana vitrectomy; VH Vitreous hemorrhage; PRP panretinal laser photocoagulation; IVK intravitreal kenalog; VMT vitreomacular traction; MH Macular hole;  NVD neovascularization of the disc; NVE neovascularization elsewhere; AREDS age related eye disease study; ARMD age related macular degeneration; POAG primary open angle glaucoma; EBMD epithelial/anterior basement membrane dystrophy; ACIOL anterior chamber intraocular lens; IOL intraocular lens; PCIOL posterior chamber intraocular lens; Phaco/IOL phacoemulsification with intraocular lens placement; [place] photorefractive keratectomy; LASIK laser assisted in situ keratomileusis; HTN hypertension; DM diabetes mellitus; COPD chronic obstructive pulmonary disease

## 2020-08-23 NOTE — Assessment & Plan Note (Signed)
Continue her topical medications as directed by Dr. Nancy Fetter

## 2020-09-05 DIAGNOSIS — G4733 Obstructive sleep apnea (adult) (pediatric): Secondary | ICD-10-CM | POA: Diagnosis not present

## 2020-10-02 ENCOUNTER — Other Ambulatory Visit (HOSPITAL_COMMUNITY): Payer: Self-pay

## 2020-10-02 MED ORDER — ROCKLATAN 0.02-0.005 % OP SOLN
1.0000 [drp] | Freq: Every evening | OPHTHALMIC | 1 refills | Status: AC
  Filled 2020-10-02: qty 2.5, 25d supply, fill #0

## 2020-10-03 ENCOUNTER — Other Ambulatory Visit (HOSPITAL_COMMUNITY): Payer: Self-pay

## 2020-10-05 DIAGNOSIS — G4733 Obstructive sleep apnea (adult) (pediatric): Secondary | ICD-10-CM | POA: Diagnosis not present

## 2020-10-06 DIAGNOSIS — G4733 Obstructive sleep apnea (adult) (pediatric): Secondary | ICD-10-CM | POA: Diagnosis not present

## 2020-10-18 ENCOUNTER — Other Ambulatory Visit (HOSPITAL_COMMUNITY): Payer: Self-pay

## 2020-10-20 ENCOUNTER — Other Ambulatory Visit (HOSPITAL_COMMUNITY): Payer: Self-pay

## 2020-10-20 DIAGNOSIS — H401111 Primary open-angle glaucoma, right eye, mild stage: Secondary | ICD-10-CM | POA: Diagnosis not present

## 2020-10-20 DIAGNOSIS — H35719 Central serous chorioretinopathy, unspecified eye: Secondary | ICD-10-CM | POA: Diagnosis not present

## 2020-10-20 MED ORDER — ROCKLATAN 0.02-0.005 % OP SOLN
1.0000 [drp] | Freq: Every evening | OPHTHALMIC | 12 refills | Status: AC
  Filled 2020-10-20: qty 7.5, 38d supply, fill #0
  Filled 2020-10-31: qty 7.5, 75d supply, fill #0
  Filled 2021-01-21: qty 7.5, 75d supply, fill #1

## 2020-10-20 MED ORDER — DORZOLAMIDE HCL-TIMOLOL MAL 2-0.5 % OP SOLN
1.0000 [drp] | Freq: Two times a day (BID) | OPHTHALMIC | 12 refills | Status: DC
  Filled 2020-10-31 – 2020-11-25 (×3): qty 10, 50d supply, fill #0
  Filled 2021-01-21: qty 10, 50d supply, fill #1
  Filled 2021-03-16: qty 10, 50d supply, fill #2
  Filled 2021-05-10: qty 10, 50d supply, fill #3
  Filled 2021-06-27: qty 10, 50d supply, fill #4
  Filled 2021-08-08: qty 10, 50d supply, fill #5
  Filled 2021-09-25: qty 10, 50d supply, fill #6

## 2020-10-24 ENCOUNTER — Other Ambulatory Visit: Payer: Self-pay

## 2020-10-24 ENCOUNTER — Ambulatory Visit (INDEPENDENT_AMBULATORY_CARE_PROVIDER_SITE_OTHER): Payer: 59 | Admitting: Ophthalmology

## 2020-10-24 ENCOUNTER — Encounter (INDEPENDENT_AMBULATORY_CARE_PROVIDER_SITE_OTHER): Payer: Self-pay | Admitting: Ophthalmology

## 2020-10-24 DIAGNOSIS — H43821 Vitreomacular adhesion, right eye: Secondary | ICD-10-CM | POA: Insufficient documentation

## 2020-10-24 DIAGNOSIS — H43823 Vitreomacular adhesion, bilateral: Secondary | ICD-10-CM

## 2020-10-24 DIAGNOSIS — H401131 Primary open-angle glaucoma, bilateral, mild stage: Secondary | ICD-10-CM

## 2020-10-24 DIAGNOSIS — H353111 Nonexudative age-related macular degeneration, right eye, early dry stage: Secondary | ICD-10-CM

## 2020-10-24 DIAGNOSIS — H35722 Serous detachment of retinal pigment epithelium, left eye: Secondary | ICD-10-CM | POA: Diagnosis not present

## 2020-10-24 DIAGNOSIS — H353221 Exudative age-related macular degeneration, left eye, with active choroidal neovascularization: Secondary | ICD-10-CM | POA: Diagnosis not present

## 2020-10-24 MED ORDER — BEVACIZUMAB 2.5 MG/0.1ML IZ SOSY
2.5000 mg | PREFILLED_SYRINGE | INTRAVITREAL | Status: AC | PRN
  Administered 2020-10-24: 2.5 mg via INTRAVITREAL

## 2020-10-24 NOTE — Progress Notes (Signed)
10/24/2020     CHIEF COMPLAINT Patient presents for  Chief Complaint  Patient presents with   Retina Follow Up      HISTORY OF PRESENT ILLNESS: Nicholas Young is a 65 y.o. male who presents to the clinic today for:   HPI     Retina Follow Up   Patient presents with  Wet AMD.  In left eye.  This started 9 weeks ago.  Severity is mild.  Duration of 9 weeks.  Since onset it is stable.        Comments   9 week fu OS and Avastin OS Pt states VA OU stable since last visit. Pt denies FOL, floaters, or ocular pain OU.  A1C: 6.4 LBS: 141 Pt reports using Cosopt BID OU and Rocklatan QHS OU       Last edited by Kendra Opitz, COA on 10/24/2020  8:17 AM.      Referring physician: Gaynelle Arabian, MD [address],  [place] [address]  HISTORICAL INFORMATION:   Selected notes from the MEDICAL RECORD NUMBER       CURRENT MEDICATIONS: Current Outpatient Medications (Ophthalmic Drugs)  Medication Sig   dorzolamide-timolol (COSOPT) 22.3-6.8 MG/ML ophthalmic solution Instill 1 drop into both eyes twice a day as directed   dorzolamide-timolol (COSOPT) 22.3-6.8 MG/ML ophthalmic solution Place 1 drop into both eyes 2 (two) times daily.   Netarsudil-Latanoprost (ROCKLATAN) 0.02-0.005 % SOLN Place 1 drop into both eyes every evening.   Netarsudil-Latanoprost (ROCKLATAN) 0.02-0.005 % SOLN Place 1 drop into both eyes every evening.   No current facility-administered medications for this visit. (Ophthalmic Drugs)   Current Outpatient Medications (Other)  Medication Sig   Budesonide (RHINOCORT ALLERGY NA) Place into the nose.   citalopram (CELEXA) 40 MG tablet Take 40 mg by mouth daily.   citalopram (CELEXA) 40 MG tablet TAKE 1 TABLET BY MOUTH ONCE DAILY.   citalopram (CELEXA) 40 MG tablet Take 1 tablet (40 mg total) by mouth daily.   glucosamine-chondroitin 500-400 MG tablet Take 1 tablet by mouth 3 (three) times daily.   glucose blood test strip Use as  directed to check blood sugar once daily   Lancets (FREESTYLE) lancets use as directed to check blood sugar daily   Multiple Vitamins-Minerals (PRESERVISION AREDS PO) Take by mouth.   rosuvastatin (CRESTOR) 5 MG tablet TAKE 1 TABLET BY MOUTH ONCE DAILY FOR CHOLESTEROL.   rosuvastatin (CRESTOR) 5 MG tablet Take 1 tablet (5 mg total) by mouth daily for cholesterol.   No current facility-administered medications for this visit. (Other)      REVIEW OF SYSTEMS:    ALLERGIES No Known Allergies  PAST MEDICAL HISTORY Past Medical History:  Diagnosis Date   Diabetes mellitus without complication ([place])    Glaucoma    Sleep apnea    Past Surgical History:  Procedure Laterality Date   ANTERIOR CERVICAL DISCECTOMY     KNEE ARTHROSCOPY      FAMILY HISTORY History reviewed. No pertinent family history.  SOCIAL HISTORY Social History   Tobacco Use   Smoking status: Never   Smokeless tobacco: Never  Substance Use Topics   Alcohol use: Yes    Comment: 1-2 beers a week         OPHTHALMIC EXAM:  Base Eye Exam     Visual Acuity (ETDRS)       Right Left   Dist [place] 20/30 -2 20/40 -1   Dist ph [place] 20/20 20/30 -2  Tonometry (Tonopen, 8:21 AM)       Right Left   Pressure 11 13         Pupils       Pupils Dark Light Shape React APD   Right PERRL 3 2 Round Brisk None   Left PERRL 3 2 Round Brisk None         Visual Fields (Counting fingers)       Left Right    Full Full         Extraocular Movement       Right Left    Full Full         Neuro/Psych     Oriented x3: Yes   Mood/Affect: Normal         Dilation     Left eye: 1.0% Mydriacyl, 2.5% Phenylephrine @ 8:21 AM           Slit Lamp and Fundus Exam     External Exam       Right Left   External Normal Normal         Slit Lamp Exam       Right Left   Lids/Lashes Normal Normal   Conjunctiva/Sclera 1+ Injection White and quiet   Cornea Clear Clear   Anterior Chamber  Deep and quiet Deep and quiet   Iris Round and reactive Round and reactive   Lens Posterior chamber intraocular lens Posterior chamber intraocular lens   Anterior Vitreous Normal Normal         Fundus Exam       Right Left   Posterior Vitreous  Central vitreous floaters   Disc  Normal   C/D Ratio  0.2   Macula  Hard drusen, Retinal pigment epithelial mottling, Pigmented atrophy with RPE atrophy in the foveal and subfoveal location,, , no macular thickening, no hemorrhage   Vessels  Normal   Periphery  Normal            IMAGING AND PROCEDURES  Imaging and Procedures for 10/24/20  OCT, Retina - OU - Both Eyes       Right Eye Quality was good. Scan locations included subfoveal. Central Foveal Thickness: 299. Progression has been stable. Findings include retinal drusen .   Left Eye Quality was good. Scan locations included subfoveal. Central Foveal Thickness: 302. Progression has improved. Findings include pigment epithelial detachment, abnormal foveal contour, retinal drusen .   Notes OS with sub RPe and subretinal fluid in the foveal location, yet much less subretinal fluid inferior to the fovea improved at this  9-week interval.  Post Avastin.  We will repeat injection again today OS  OD stable     Intravitreal Injection, Pharmacologic Agent - OS - Left Eye       Time Out 10/24/2020. 8:44 AM. Confirmed correct patient, procedure, site, and patient consented.   Anesthesia Topical anesthesia was used. Anesthetic medications included [place] 3.5%.   Procedure Preparation included Ofloxacin , 5% betadine to ocular surface, 10% betadine to eyelids, Tobramycin 0.3%. A 30 gauge needle was used.   Injection: 2.5 mg bevacizumab 2.5 MG/0.1ML   Route: Intravitreal, Site: Left Eye   NDC: [phone], Lot: [id number]   Post-op Post injection exam found visual acuity of at least counting fingers. The patient tolerated the procedure well. There were no complications. The  patient received written and verbal post procedure care education. Post injection medications were not given.              ASSESSMENT/PLAN:  Serous detachment of retinal pigment epithelium of left eye Component of wet AMD and possibly from vitreal macular adhesion as well with broad-based detachment.  Primary open angle glaucoma of both eyes, mild stage Intraocular pressures today are stable  Exudative age-related macular degeneration of left eye with active choroidal neovascularization (HCC) Serous retinal detachment associated with wet AMD.  Currently at 9-week follow-up and no interval change no anatomic changes no acuity changes  Early stage nonexudative age-related macular degeneration of right eye No signs of CNVM  Vitreomacular adhesion of both eyes OU, will look for spontaneous release into a posterior vitreous detachment as this may decrease tractional change in the fovea left eye     ICD-10-CM   1. Exudative age-related macular degeneration of left eye with active choroidal neovascularization (HCC)  H35.3221 OCT, Retina - OU - Both Eyes    Intravitreal Injection, Pharmacologic Agent - OS - Left Eye    bevacizumab (AVASTIN) SOSY 2.5 mg    2. Serous detachment of retinal pigment epithelium of left eye  H35.722     3. Primary open angle glaucoma of both eyes, mild stage  H40.1131     4. Early stage nonexudative age-related macular degeneration of right eye  H35.3111     5. Vitreomacular adhesion of both eyes  H43.823       1.  OS, with serous retinal detachment subfoveal is a component of wet AMD in the past.  Prior events patient has had intraretinal fluid particularly inferior to the fovea.  There is no intraretinal fluid today however the serous component of RPE detachment continues.  Stable however at 9-week interval.  We will treat again today to maintain current and anatomic findings.  2.  Discussions reviewed with the patient will not extend interval therapy  follow-up again in 9 weeks  3.  Ophthalmic Meds Ordered this visit:  Meds ordered this encounter  Medications   bevacizumab (AVASTIN) SOSY 2.5 mg       Return in about 9 weeks (around 12/26/2020) for dilate, OS, AVASTIN OCT.  There are no Patient Instructions on file for this visit.   Explained the diagnoses, plan, and follow up with the patient and they expressed understanding.  Patient expressed understanding of the importance of proper follow up care.   Clent Demark Afrah Burlison M.D. Diseases & Surgery of the Retina and Vitreous Retina & Diabetic [place] 10/24/20     Abbreviations: M myopia (nearsighted); A astigmatism; H hyperopia (farsighted); P presbyopia; Mrx spectacle prescription;  CTL contact lenses; OD right eye; OS left eye; OU both eyes  XT exotropia; ET esotropia; PEK punctate epithelial keratitis; PEE punctate epithelial erosions; DES dry eye syndrome; MGD meibomian gland dysfunction; ATs artificial tears; PFAT's preservative free artificial tears; [place] nuclear sclerotic cataract; PSC posterior subcapsular cataract; ERM epi-retinal membrane; PVD posterior vitreous detachment; RD retinal detachment; DM diabetes mellitus; DR diabetic retinopathy; NPDR non-proliferative diabetic retinopathy; PDR proliferative diabetic retinopathy; CSME clinically significant macular edema; DME diabetic macular edema; dbh dot blot hemorrhages; CWS cotton wool spot; POAG primary open angle glaucoma; C/D cup-to-disc ratio; HVF humphrey visual field; GVF goldmann visual field; OCT optical coherence tomography; IOP intraocular pressure; BRVO Branch retinal vein occlusion; CRVO central retinal vein occlusion; CRAO central retinal artery occlusion; BRAO branch retinal artery occlusion; RT retinal tear; SB scleral buckle; PPV pars plana vitrectomy; VH Vitreous hemorrhage; PRP panretinal laser photocoagulation; IVK intravitreal kenalog; VMT vitreomacular traction; MH Macular hole;  NVD neovascularization of the  disc; NVE neovascularization elsewhere; AREDS  age related eye disease study; ARMD age related macular degeneration; POAG primary open angle glaucoma; EBMD epithelial/anterior basement membrane dystrophy; ACIOL anterior chamber intraocular lens; IOL intraocular lens; PCIOL posterior chamber intraocular lens; Phaco/IOL phacoemulsification with intraocular lens placement; [place] photorefractive keratectomy; LASIK laser assisted in situ keratomileusis; HTN hypertension; DM diabetes mellitus; COPD chronic obstructive pulmonary disease

## 2020-10-24 NOTE — Assessment & Plan Note (Signed)
Serous retinal detachment associated with wet AMD.  Currently at 9-week follow-up and no interval change no anatomic changes no acuity changes

## 2020-10-24 NOTE — Assessment & Plan Note (Signed)
OU, will look for spontaneous release into a posterior vitreous detachment as this may decrease tractional change in the fovea left eye

## 2020-10-24 NOTE — Assessment & Plan Note (Signed)
Intraocular pressures today are stable

## 2020-10-24 NOTE — Assessment & Plan Note (Signed)
No signs of CNVM

## 2020-10-24 NOTE — Assessment & Plan Note (Signed)
Component of wet AMD and possibly from vitreal macular adhesion as well with broad-based detachment.

## 2020-11-01 ENCOUNTER — Other Ambulatory Visit (HOSPITAL_COMMUNITY): Payer: Self-pay

## 2020-11-09 DIAGNOSIS — H401131 Primary open-angle glaucoma, bilateral, mild stage: Secondary | ICD-10-CM | POA: Diagnosis not present

## 2020-11-09 DIAGNOSIS — H35712 Central serous chorioretinopathy, left eye: Secondary | ICD-10-CM | POA: Diagnosis not present

## 2020-11-09 DIAGNOSIS — H353130 Nonexudative age-related macular degeneration, bilateral, stage unspecified: Secondary | ICD-10-CM | POA: Diagnosis not present

## 2020-11-09 DIAGNOSIS — H11433 Conjunctival hyperemia, bilateral: Secondary | ICD-10-CM | POA: Diagnosis not present

## 2020-11-21 ENCOUNTER — Other Ambulatory Visit (HOSPITAL_COMMUNITY): Payer: Self-pay

## 2020-11-27 ENCOUNTER — Other Ambulatory Visit (HOSPITAL_COMMUNITY): Payer: Self-pay

## 2020-12-04 DIAGNOSIS — G4733 Obstructive sleep apnea (adult) (pediatric): Secondary | ICD-10-CM | POA: Diagnosis not present

## 2020-12-26 ENCOUNTER — Other Ambulatory Visit: Payer: Self-pay

## 2020-12-26 ENCOUNTER — Encounter (INDEPENDENT_AMBULATORY_CARE_PROVIDER_SITE_OTHER): Payer: Self-pay | Admitting: Ophthalmology

## 2020-12-26 ENCOUNTER — Encounter (INDEPENDENT_AMBULATORY_CARE_PROVIDER_SITE_OTHER): Payer: 59 | Admitting: Ophthalmology

## 2020-12-26 ENCOUNTER — Ambulatory Visit (INDEPENDENT_AMBULATORY_CARE_PROVIDER_SITE_OTHER): Payer: 59 | Admitting: Ophthalmology

## 2020-12-26 DIAGNOSIS — H35722 Serous detachment of retinal pigment epithelium, left eye: Secondary | ICD-10-CM

## 2020-12-26 DIAGNOSIS — H353221 Exudative age-related macular degeneration, left eye, with active choroidal neovascularization: Secondary | ICD-10-CM | POA: Diagnosis not present

## 2020-12-26 MED ORDER — BEVACIZUMAB 2.5 MG/0.1ML IZ SOSY
2.5000 mg | PREFILLED_SYRINGE | INTRAVITREAL | Status: AC | PRN
  Administered 2020-12-26: 2.5 mg via INTRAVITREAL

## 2020-12-26 NOTE — Progress Notes (Signed)
12/26/2020     CHIEF COMPLAINT Patient presents for  Chief Complaint  Patient presents with   Retina Follow Up      HISTORY OF PRESENT ILLNESS: Nicholas Young is a 65 y.o. male who presents to the clinic today for:   HPI     Retina Follow Up   Patient presents with  Wet AMD.  In left eye.  This started 9 weeks ago.  Severity is mild.  Duration of 9 weeks.  Since onset it is stable.        Comments   9 week fu OS oct avastin OS. Patient states vision is stable and unchanged since last visit. Denies any new floaters or FOL. Pt uses Cosopt bid OU and Rocklatan qhs OU.      Last edited by Laurin Coder on 12/26/2020  3:36 PM.      Referring physician: Gaynelle Arabian, MD [address] [place] [address],  [place] [address]  HISTORICAL INFORMATION:   Selected notes from the MEDICAL RECORD NUMBER       CURRENT MEDICATIONS: Current Outpatient Medications (Ophthalmic Drugs)  Medication Sig   dorzolamide-timolol (COSOPT) 22.3-6.8 MG/ML ophthalmic solution Instill 1 drop into both eyes twice a day as directed   dorzolamide-timolol (COSOPT) 22.3-6.8 MG/ML ophthalmic solution Place 1 drop into both eyes 2 (two) times daily.   Netarsudil-Latanoprost (ROCKLATAN) 0.02-0.005 % SOLN Place 1 drop into both eyes every evening.   Netarsudil-Latanoprost (ROCKLATAN) 0.02-0.005 % SOLN Place 1 drop into both eyes every evening.   No current facility-administered medications for this visit. (Ophthalmic Drugs)   Current Outpatient Medications (Other)  Medication Sig   Budesonide (RHINOCORT ALLERGY NA) Place into the nose.   citalopram (CELEXA) 40 MG tablet Take 40 mg by mouth daily.   citalopram (CELEXA) 40 MG tablet TAKE 1 TABLET BY MOUTH ONCE DAILY.   citalopram (CELEXA) 40 MG tablet Take 1 tablet (40 mg total) by mouth daily.   glucosamine-chondroitin 500-400 MG tablet Take 1 tablet by mouth 3 (three) times daily.   glucose blood test strip Use as directed to check blood  sugar once daily   Lancets (FREESTYLE) lancets use as directed to check blood sugar daily   Multiple Vitamins-Minerals (PRESERVISION AREDS PO) Take by mouth.   rosuvastatin (CRESTOR) 5 MG tablet TAKE 1 TABLET BY MOUTH ONCE DAILY FOR CHOLESTEROL.   rosuvastatin (CRESTOR) 5 MG tablet Take 1 tablet (5 mg total) by mouth daily for cholesterol.   No current facility-administered medications for this visit. (Other)      REVIEW OF SYSTEMS:    ALLERGIES No Known Allergies  PAST MEDICAL HISTORY Past Medical History:  Diagnosis Date   Diabetes mellitus without complication ([place])    Glaucoma    Sleep apnea    Past Surgical History:  Procedure Laterality Date   ANTERIOR CERVICAL DISCECTOMY     KNEE ARTHROSCOPY      FAMILY HISTORY History reviewed. No pertinent family history.  SOCIAL HISTORY Social History   Tobacco Use   Smoking status: Never   Smokeless tobacco: Never  Substance Use Topics   Alcohol use: Yes    Comment: 1-2 beers a week         OPHTHALMIC EXAM:  Base Eye Exam     Visual Acuity (ETDRS)       Right Left   Dist [place] 20/20 -1 20/40 -2   Dist ph [place]  20/25 -1         Tonometry (  Tonopen, 3:40 PM)       Right Left   Pressure 10 12         Pupils       Pupils Dark Light APD   Right PERRL 3 2 None   Left PERRL 3 2 None         Extraocular Movement       Right Left    Full Full         Neuro/Psych     Oriented x3: Yes   Mood/Affect: Normal         Dilation     Left eye: 1.0% Mydriacyl, 2.5% Phenylephrine @ 3:40 PM           Slit Lamp and Fundus Exam     External Exam       Right Left   External Normal Normal         Slit Lamp Exam       Right Left   Lids/Lashes Normal Normal   Conjunctiva/Sclera 1+ Injection White and quiet   Cornea Clear Clear   Anterior Chamber Deep and quiet Deep and quiet   Iris Round and reactive Round and reactive   Lens Posterior chamber intraocular lens Posterior chamber  intraocular lens   Anterior Vitreous Normal Normal         Fundus Exam       Right Left   Posterior Vitreous  Central vitreous floaters   Disc  Normal   C/D Ratio  0.2   Macula  Hard drusen, Retinal pigment epithelial mottling, Pigmented atrophy with RPE atrophy in the foveal and subfoveal location,, , no macular thickening, no hemorrhage   Vessels  Normal   Periphery  Normal            IMAGING AND PROCEDURES  Imaging and Procedures for 12/26/20  Intravitreal Injection, Pharmacologic Agent - OS - Left Eye       Time Out 12/26/2020. 4:02 PM. Confirmed correct patient, procedure, site, and patient consented.   Anesthesia Topical anesthesia was used. Anesthetic medications included Lidocaine 4%.   Procedure Preparation included Ofloxacin , 5% betadine to ocular surface, 10% betadine to eyelids, Tobramycin 0.3%. A 30 gauge needle was used.   Injection: 2.5 mg bevacizumab 2.5 MG/0.1ML   Route: Intravitreal, Site: Left Eye   NDC: [phone], Lot: [id number]   Post-op Post injection exam found visual acuity of at least counting fingers. The patient tolerated the procedure well. There were no complications. The patient received written and verbal post procedure care education. Post injection medications were not given.      OCT, Retina - OU - Both Eyes       Right Eye Quality was good. Scan locations included subfoveal. Central Foveal Thickness: 297. Progression has been stable. Findings include retinal drusen .   Left Eye Quality was good. Scan locations included subfoveal. Central Foveal Thickness: 300. Progression has improved. Findings include pigment epithelial detachment, abnormal foveal contour, retinal drusen .   Notes OS with sub RPe and subretinal fluid in the foveal location, yet much less subretinal fluid inferior to the fovea improved at this  9-week interval.  Post Avastin.  We will repeat injection again today OS  OD stable              ASSESSMENT/PLAN:  Serous detachment of retinal pigment epithelium of left eye Stable OS no change over time  Exudative age-related macular degeneration of left eye with active choroidal neovascularization (HCC) Currently at 9-week  interval, still stable with small serous component no intraretinal fluid at this visit     ICD-10-CM   1. Exudative age-related macular degeneration of left eye with active choroidal neovascularization (HCC)  H35.3221 Intravitreal Injection, Pharmacologic Agent - OS - Left Eye    OCT, Retina - OU - Both Eyes    bevacizumab (AVASTIN) SOSY 2.5 mg    2. Serous detachment of retinal pigment epithelium of left eye  H35.722       1.  OS still stable and improved overall with less intraretinal fluid from juxta foveal CNVM in the past.  Small serous retinal detachment subfoveal persist however not likely causative regarding CNVM  2.  Repeat intravitreal Avastin OS today at 9 weeks and follow-up again in 9 weeks  3.  Ophthalmic Meds Ordered this visit:  Meds ordered this encounter  Medications   bevacizumab (AVASTIN) SOSY 2.5 mg       Return in about 9 weeks (around 02/27/2021) for dilate, OS, AVASTIN OCT.  There are no Patient Instructions on file for this visit.   Explained the diagnoses, plan, and follow up with the patient and they expressed understanding.  Patient expressed understanding of the importance of proper follow up care.   Clent Demark Shayaan Parke M.D. Diseases & Surgery of the Retina and Vitreous Retina & Diabetic [place] 12/26/20     Abbreviations: M myopia (nearsighted); A astigmatism; H hyperopia (farsighted); P presbyopia; Mrx spectacle prescription;  CTL contact lenses; OD right eye; OS left eye; OU both eyes  XT exotropia; ET esotropia; PEK punctate epithelial keratitis; PEE punctate epithelial erosions; DES dry eye syndrome; MGD meibomian gland dysfunction; ATs artificial tears; PFAT's preservative free artificial tears; [place] nuclear  sclerotic cataract; PSC posterior subcapsular cataract; ERM epi-retinal membrane; PVD posterior vitreous detachment; RD retinal detachment; DM diabetes mellitus; DR diabetic retinopathy; NPDR non-proliferative diabetic retinopathy; PDR proliferative diabetic retinopathy; CSME clinically significant macular edema; DME diabetic macular edema; dbh dot blot hemorrhages; CWS cotton wool spot; POAG primary open angle glaucoma; C/D cup-to-disc ratio; HVF humphrey visual field; GVF goldmann visual field; OCT optical coherence tomography; IOP intraocular pressure; BRVO Branch retinal vein occlusion; CRVO central retinal vein occlusion; CRAO central retinal artery occlusion; BRAO branch retinal artery occlusion; RT retinal tear; SB scleral buckle; PPV pars plana vitrectomy; VH Vitreous hemorrhage; PRP panretinal laser photocoagulation; IVK intravitreal kenalog; VMT vitreomacular traction; MH Macular hole;  NVD neovascularization of the disc; NVE neovascularization elsewhere; AREDS age related eye disease study; ARMD age related macular degeneration; POAG primary open angle glaucoma; EBMD epithelial/anterior basement membrane dystrophy; ACIOL anterior chamber intraocular lens; IOL intraocular lens; PCIOL posterior chamber intraocular lens; Phaco/IOL phacoemulsification with intraocular lens placement; [place] photorefractive keratectomy; LASIK laser assisted in situ keratomileusis; HTN hypertension; DM diabetes mellitus; COPD chronic obstructive pulmonary disease

## 2020-12-26 NOTE — Assessment & Plan Note (Signed)
Currently at 9-week interval, still stable with small serous component no intraretinal fluid at this visit

## 2020-12-26 NOTE — Assessment & Plan Note (Signed)
Stable OS no change over time

## 2020-12-27 ENCOUNTER — Other Ambulatory Visit (HOSPITAL_COMMUNITY): Payer: Self-pay

## 2020-12-27 DIAGNOSIS — E785 Hyperlipidemia, unspecified: Secondary | ICD-10-CM | POA: Diagnosis not present

## 2020-12-27 DIAGNOSIS — E1169 Type 2 diabetes mellitus with other specified complication: Secondary | ICD-10-CM | POA: Diagnosis not present

## 2020-12-27 DIAGNOSIS — G4733 Obstructive sleep apnea (adult) (pediatric): Secondary | ICD-10-CM | POA: Diagnosis not present

## 2020-12-27 DIAGNOSIS — Z23 Encounter for immunization: Secondary | ICD-10-CM | POA: Diagnosis not present

## 2020-12-27 DIAGNOSIS — F322 Major depressive disorder, single episode, severe without psychotic features: Secondary | ICD-10-CM | POA: Diagnosis not present

## 2020-12-27 DIAGNOSIS — H3530 Unspecified macular degeneration: Secondary | ICD-10-CM | POA: Diagnosis not present

## 2020-12-27 DIAGNOSIS — H409 Unspecified glaucoma: Secondary | ICD-10-CM | POA: Diagnosis not present

## 2020-12-27 MED ORDER — ROSUVASTATIN CALCIUM 5 MG PO TABS
5.0000 mg | ORAL_TABLET | Freq: Every day | ORAL | 3 refills | Status: AC
  Filled 2020-12-27: qty 90, 90d supply, fill #0
  Filled 2021-06-27: qty 90, 90d supply, fill #1
  Filled 2021-08-08 – 2021-12-03 (×2): qty 90, 90d supply, fill #2

## 2020-12-27 MED ORDER — CITALOPRAM HYDROBROMIDE 40 MG PO TABS
40.0000 mg | ORAL_TABLET | Freq: Every day | ORAL | 3 refills | Status: DC
  Filled 2020-12-27 – 2021-01-21 (×2): qty 90, 90d supply, fill #0

## 2021-01-03 DIAGNOSIS — G4733 Obstructive sleep apnea (adult) (pediatric): Secondary | ICD-10-CM | POA: Diagnosis not present

## 2021-01-22 ENCOUNTER — Other Ambulatory Visit (HOSPITAL_COMMUNITY): Payer: Self-pay

## 2021-02-28 DIAGNOSIS — H3530 Unspecified macular degeneration: Secondary | ICD-10-CM | POA: Diagnosis not present

## 2021-02-28 DIAGNOSIS — H409 Unspecified glaucoma: Secondary | ICD-10-CM | POA: Diagnosis not present

## 2021-02-28 DIAGNOSIS — L309 Dermatitis, unspecified: Secondary | ICD-10-CM | POA: Diagnosis not present

## 2021-03-05 DIAGNOSIS — L233 Allergic contact dermatitis due to drugs in contact with skin: Secondary | ICD-10-CM | POA: Diagnosis not present

## 2021-03-05 DIAGNOSIS — H11433 Conjunctival hyperemia, bilateral: Secondary | ICD-10-CM | POA: Diagnosis not present

## 2021-03-07 ENCOUNTER — Ambulatory Visit (INDEPENDENT_AMBULATORY_CARE_PROVIDER_SITE_OTHER): Payer: 59 | Admitting: Ophthalmology

## 2021-03-07 ENCOUNTER — Other Ambulatory Visit: Payer: Self-pay

## 2021-03-07 ENCOUNTER — Encounter (INDEPENDENT_AMBULATORY_CARE_PROVIDER_SITE_OTHER): Payer: Self-pay | Admitting: Ophthalmology

## 2021-03-07 DIAGNOSIS — H353221 Exudative age-related macular degeneration, left eye, with active choroidal neovascularization: Secondary | ICD-10-CM

## 2021-03-07 DIAGNOSIS — E119 Type 2 diabetes mellitus without complications: Secondary | ICD-10-CM | POA: Diagnosis not present

## 2021-03-07 MED ORDER — BEVACIZUMAB 2.5 MG/0.1ML IZ SOSY
2.5000 mg | PREFILLED_SYRINGE | INTRAVITREAL | Status: AC | PRN
  Administered 2021-03-07: 09:00:00 2.5 mg via INTRAVITREAL

## 2021-03-07 NOTE — Assessment & Plan Note (Signed)
OS now at 9 weeks, still no intraretinal fluid returns.  Subfoveal serous pigment epithelial detachment does remain.  On Avastin currently.  Discussion may consider change to Vabysmo next visit in an attempt to use a smaller molecule as well as find a medication that can increase his interval of examinations and monitoring

## 2021-03-07 NOTE — Assessment & Plan Note (Signed)
Continue blood sugar control

## 2021-03-07 NOTE — Progress Notes (Signed)
03/07/2021     CHIEF COMPLAINT Patient presents for  Chief Complaint  Patient presents with   Retina Follow Up      HISTORY OF PRESENT ILLNESS: Nicholas Young is a 66 y.o. male who presents to the clinic today for:   HPI     Retina Follow Up           Diagnosis: Wet AMD   Laterality: right eye   Onset: 10 weeks ago   Severity: mild   Duration: 10 weeks   Course: stable         Comments   10 weeks fu OS oct Avastin OS. Patient states vision is stable and unchanged since last visit. Denies any new floaters or FOL. Pt states his eyes were red and itchy so he went to see his Optometrist, Dr. Oswaldo Conroy at [facility], and they stated it was a reaction to the [place]. He is now on Vyzulta OU QD instead of Rocklatan. Pt is also using Cosopt BID OU.      Last edited by Laurin Coder on 03/07/2021  8:07 AM.      Referring physician: Gaynelle Arabian, MD [address],  [place] [address]  HISTORICAL INFORMATION:   Selected notes from the MEDICAL RECORD NUMBER       CURRENT MEDICATIONS: Current Outpatient Medications (Ophthalmic Drugs)  Medication Sig   dorzolamide-timolol (COSOPT) 22.3-6.8 MG/ML ophthalmic solution Instill 1 drop into both eyes twice a day as directed   dorzolamide-timolol (COSOPT) 22.3-6.8 MG/ML ophthalmic solution Place 1 drop into both eyes 2 (two) times daily.   Netarsudil-Latanoprost (ROCKLATAN) 0.02-0.005 % SOLN Place 1 drop into both eyes every evening.   Netarsudil-Latanoprost (ROCKLATAN) 0.02-0.005 % SOLN Place 1 drop into both eyes every evening.   No current facility-administered medications for this visit. (Ophthalmic Drugs)   Current Outpatient Medications (Other)  Medication Sig   Budesonide (RHINOCORT ALLERGY NA) Place into the nose.   citalopram (CELEXA) 40 MG tablet Take 40 mg by mouth daily.   citalopram (CELEXA) 40 MG tablet TAKE 1 TABLET BY MOUTH ONCE DAILY.   citalopram (CELEXA) 40 MG tablet Take 1  tablet (40 mg total) by mouth daily.   citalopram (CELEXA) 40 MG tablet Take 1 tablet (40 mg total) by mouth daily.   glucosamine-chondroitin 500-400 MG tablet Take 1 tablet by mouth 3 (three) times daily.   glucose blood test strip Use as directed to check blood sugar once daily   Lancets (FREESTYLE) lancets use as directed to check blood sugar daily   Multiple Vitamins-Minerals (PRESERVISION AREDS PO) Take by mouth.   rosuvastatin (CRESTOR) 5 MG tablet TAKE 1 TABLET BY MOUTH ONCE DAILY FOR CHOLESTEROL.   rosuvastatin (CRESTOR) 5 MG tablet Take 1 tablet (5 mg total) by mouth daily for cholesterol.   rosuvastatin (CRESTOR) 5 MG tablet Take 1 tablet (5 mg total) by mouth daily for cholesterol   No current facility-administered medications for this visit. (Other)      REVIEW OF SYSTEMS:    ALLERGIES No Known Allergies  PAST MEDICAL HISTORY Past Medical History:  Diagnosis Date   Diabetes mellitus without complication ([place])    Glaucoma    Sleep apnea    Past Surgical History:  Procedure Laterality Date   ANTERIOR CERVICAL DISCECTOMY     KNEE ARTHROSCOPY      FAMILY HISTORY History reviewed. No pertinent family history.  SOCIAL HISTORY Social History   Tobacco Use   Smoking status:  Never   Smokeless tobacco: Never  Substance Use Topics   Alcohol use: Yes    Comment: 1-2 beers a week         OPHTHALMIC EXAM:  Base Eye Exam     Visual Acuity (ETDRS)       Right Left   Dist cc 20/25 20/30 -1   Dist ph cc  20/20 -1         Tonometry (Tonopen, 8:11 AM)       Right Left   Pressure 12 11         Pupils       Pupils Dark Light Shape APD   Right PERRL 3 2 Round None   Left PERRL 3 2 Round None         Visual Fields       Left Right    Full Full         Extraocular Movement       Right Left    Full Full         Neuro/Psych     Oriented x3: Yes   Mood/Affect: Normal         Dilation     Left eye: 1.0% Mydriacyl, 2.5%  Phenylephrine @ 8:11 AM           Slit Lamp and Fundus Exam     External Exam       Right Left   External Normal Normal         Slit Lamp Exam       Right Left   Lids/Lashes Normal Normal   Conjunctiva/Sclera 1+ Injection White and quiet   Cornea Clear Clear   Anterior Chamber Deep and quiet Deep and quiet   Iris Round and reactive Round and reactive   Lens Posterior chamber intraocular lens Posterior chamber intraocular lens   Anterior Vitreous Normal Normal         Fundus Exam       Right Left   Posterior Vitreous  Central vitreous floaters   Disc  Normal   C/D Ratio  0.2   Macula  Hard drusen, Retinal pigment epithelial mottling, Pigmented atrophy with RPE atrophy in the foveal and subfoveal location,, , no macular thickening, no hemorrhage   Vessels  no DR   Periphery  Normal            IMAGING AND PROCEDURES  Imaging and Procedures for 03/07/21  Intravitreal Injection, Pharmacologic Agent - OS - Left Eye       Time Out 03/07/2021. 8:36 AM. Confirmed correct patient, procedure, site, and patient consented.   Anesthesia Topical anesthesia was used. Anesthetic medications included Lidocaine 4%.   Procedure Preparation included Ofloxacin , 5% betadine to ocular surface, 10% betadine to eyelids, Tobramycin 0.3%. A 30 gauge needle was used.   Injection: 2.5 mg bevacizumab 2.5 MG/0.1ML   Route: Intravitreal, Site: Left Eye   NDC: [phone], Lot: [id number]   Post-op Post injection exam found visual acuity of at least counting fingers. The patient tolerated the procedure well. There were no complications. The patient received written and verbal post procedure care education. Post injection medications were not given.      OCT, Retina - OU - Both Eyes       Right Eye Quality was good. Scan locations included subfoveal. Central Foveal Thickness: 302. Progression has been stable. Findings include retinal drusen .   Left Eye Quality was good.  Scan locations included subfoveal. Central Foveal Thickness:  303. Progression has improved. Findings include pigment epithelial detachment, abnormal foveal contour, retinal drusen .   Notes OS with sub RPe and subretinal fluid in the foveal location, yet much less subretinal fluid inferior to the fovea improved at this  9-week interval.  Post Avastin.  We will repeat injection again today OS  OD stable             ASSESSMENT/PLAN:  Exudative age-related macular degeneration of left eye with active choroidal neovascularization (HCC) OS now at 9 weeks, still no intraretinal fluid returns.  Subfoveal serous pigment epithelial detachment does remain.  On Avastin currently.  Discussion may consider change to Vabysmo next visit in an attempt to use a smaller molecule as well as find a medication that can increase his interval of examinations and monitoring  Diabetes mellitus without complication ([place]) Continue blood sugar control     ICD-10-CM   1. Exudative age-related macular degeneration of left eye with active choroidal neovascularization (HCC)  H35.3221 Intravitreal Injection, Pharmacologic Agent - OS - Left Eye    OCT, Retina - OU - Both Eyes    bevacizumab (AVASTIN) SOSY 2.5 mg    2. Diabetes mellitus without complication ([place])  Z61.0       1.  OS looks great today, no intraretinal fluid as compared to June 2021.  All serous subfoveal retinal pigment epithelial detachment does remain.  At 9-week interval today.  We will consider use of Vabysmo and schedule Vabysmo next visit in 9 weeks attempt to decrease treatment burden and increase interval of therapy and examination  2.  3.  Ophthalmic Meds Ordered this visit:  Meds ordered this encounter  Medications   bevacizumab (AVASTIN) SOSY 2.5 mg       Return in about 9 weeks (around 05/09/2021) for dilate, OS, VABYSMO OCT.  There are no Patient Instructions on file for this visit.   Explained the diagnoses, plan, and  follow up with the patient and they expressed understanding.  Patient expressed understanding of the importance of proper follow up care.   Clent Demark Lorene Samaan M.D. Diseases & Surgery of the Retina and Vitreous [facility] 03/07/21     Abbreviations: M myopia (nearsighted); A astigmatism; H hyperopia (farsighted); P presbyopia; Mrx spectacle prescription;  CTL contact lenses; OD right eye; OS left eye; OU both eyes  XT exotropia; ET esotropia; PEK punctate epithelial keratitis; PEE punctate epithelial erosions; DES dry eye syndrome; MGD meibomian gland dysfunction; ATs artificial tears; PFAT's preservative free artificial tears; [place] nuclear sclerotic cataract; PSC posterior subcapsular cataract; ERM epi-retinal membrane; PVD posterior vitreous detachment; RD retinal detachment; DM diabetes mellitus; DR diabetic retinopathy; NPDR non-proliferative diabetic retinopathy; PDR proliferative diabetic retinopathy; CSME clinically significant macular edema; DME diabetic macular edema; dbh dot blot hemorrhages; CWS cotton wool spot; POAG primary open angle glaucoma; C/D cup-to-disc ratio; HVF humphrey visual field; GVF goldmann visual field; OCT optical coherence tomography; IOP intraocular pressure; BRVO Branch retinal vein occlusion; CRVO central retinal vein occlusion; CRAO central retinal artery occlusion; BRAO branch retinal artery occlusion; RT retinal tear; SB scleral buckle; PPV pars plana vitrectomy; VH Vitreous hemorrhage; PRP panretinal laser photocoagulation; IVK intravitreal kenalog; VMT vitreomacular traction; MH Macular hole;  NVD neovascularization of the disc; NVE neovascularization elsewhere; AREDS age related eye disease study; ARMD age related macular degeneration; POAG primary open angle glaucoma; EBMD epithelial/anterior basement membrane dystrophy; ACIOL anterior chamber intraocular lens; IOL intraocular lens; PCIOL posterior chamber intraocular lens; Phaco/IOL  phacoemulsification with intraocular lens placement;  [place] photorefractive keratectomy; LASIK laser assisted in situ keratomileusis; HTN hypertension; DM diabetes mellitus; COPD chronic obstructive pulmonary disease

## 2021-03-16 ENCOUNTER — Other Ambulatory Visit (HOSPITAL_COMMUNITY): Payer: Self-pay

## 2021-03-20 ENCOUNTER — Other Ambulatory Visit (HOSPITAL_COMMUNITY): Payer: Self-pay

## 2021-03-20 DIAGNOSIS — H401131 Primary open-angle glaucoma, bilateral, mild stage: Secondary | ICD-10-CM | POA: Diagnosis not present

## 2021-03-20 MED ORDER — VYZULTA 0.024 % OP SOLN
1.0000 [drp] | Freq: Every day | OPHTHALMIC | 3 refills | Status: AC
  Filled 2021-03-20: qty 2.5, 37d supply, fill #0
  Filled 2021-03-20: qty 2.5, 30d supply, fill #0
  Filled 2021-03-21 – 2021-03-26 (×2): qty 2.5, 25d supply, fill #0

## 2021-03-21 ENCOUNTER — Other Ambulatory Visit (HOSPITAL_COMMUNITY): Payer: Self-pay

## 2021-03-26 ENCOUNTER — Other Ambulatory Visit (HOSPITAL_COMMUNITY): Payer: Self-pay

## 2021-03-26 MED ORDER — LUMIGAN 0.01 % OP SOLN
1.0000 [drp] | Freq: Every day | OPHTHALMIC | 0 refills | Status: DC
  Filled 2021-03-26: qty 2.5, 25d supply, fill #0

## 2021-03-27 ENCOUNTER — Other Ambulatory Visit (HOSPITAL_COMMUNITY): Payer: Self-pay

## 2021-04-03 DIAGNOSIS — G4733 Obstructive sleep apnea (adult) (pediatric): Secondary | ICD-10-CM | POA: Diagnosis not present

## 2021-04-09 ENCOUNTER — Other Ambulatory Visit (HOSPITAL_COMMUNITY): Payer: Self-pay

## 2021-04-10 ENCOUNTER — Other Ambulatory Visit (HOSPITAL_COMMUNITY): Payer: Self-pay

## 2021-04-11 ENCOUNTER — Other Ambulatory Visit (HOSPITAL_COMMUNITY): Payer: Self-pay

## 2021-04-11 DIAGNOSIS — H401131 Primary open-angle glaucoma, bilateral, mild stage: Secondary | ICD-10-CM | POA: Diagnosis not present

## 2021-04-20 ENCOUNTER — Other Ambulatory Visit (HOSPITAL_COMMUNITY): Payer: Self-pay

## 2021-04-25 ENCOUNTER — Other Ambulatory Visit (HOSPITAL_COMMUNITY): Payer: Self-pay

## 2021-04-25 MED ORDER — LUMIGAN 0.01 % OP SOLN
1.0000 [drp] | Freq: Every day | OPHTHALMIC | 2 refills | Status: AC
  Filled 2021-04-25: qty 2.5, 25d supply, fill #0
  Filled 2021-05-29: qty 2.5, 25d supply, fill #1
  Filled 2021-07-19: qty 2.5, 25d supply, fill #2
  Filled 2021-08-08: qty 2.5, 25d supply, fill #3
  Filled 2021-09-12: qty 2.5, 25d supply, fill #4
  Filled 2022-04-01 – 2022-04-03 (×2): qty 2.5, 25d supply, fill #5

## 2021-05-09 ENCOUNTER — Other Ambulatory Visit: Payer: Self-pay

## 2021-05-09 ENCOUNTER — Encounter (INDEPENDENT_AMBULATORY_CARE_PROVIDER_SITE_OTHER): Payer: Self-pay | Admitting: Ophthalmology

## 2021-05-09 ENCOUNTER — Ambulatory Visit (INDEPENDENT_AMBULATORY_CARE_PROVIDER_SITE_OTHER): Payer: 59 | Admitting: Ophthalmology

## 2021-05-09 DIAGNOSIS — H353221 Exudative age-related macular degeneration, left eye, with active choroidal neovascularization: Secondary | ICD-10-CM | POA: Diagnosis not present

## 2021-05-09 MED ORDER — FARICIMAB-SVOA 6 MG/0.05ML IZ SOLN
6.0000 mg | INTRAVITREAL | Status: AC | PRN
  Administered 2021-05-09: 6 mg via INTRAVITREAL

## 2021-05-09 NOTE — Progress Notes (Signed)
? ? ?05/09/2021 ? ?  ? ?CHIEF COMPLAINT ?Patient presents for  ?Chief Complaint  ?Patient presents with  ? Macular Degeneration  ? ? ? ? ?HISTORY OF PRESENT ILLNESS: ?Nicholas Young is a 66 y.o. male who presents to the clinic today for:  ? ?HPI   ?9 weeks for dilate, OS VABYSMO, OCT. ?Pt states no changes in vision. ?Pt denies floaters and FOL. ? ? ?Last edited by Silvestre Moment on 05/09/2021  8:03 AM.  ?  ? ? ?Referring physician: ?Gaynelle Arabian, MD ?[place] ?[address] ?[place],  [place] [address] ? ?HISTORICAL INFORMATION:  ? ?Selected notes from the [facility] ?  ?   ? ?CURRENT MEDICATIONS: ?Current Outpatient Medications (Ophthalmic Drugs)  ?Medication Sig  ? dorzolamide-timolol (COSOPT) 22.3-6.8 MG/ML ophthalmic solution Instill 1 drop into both eyes twice a day as directed  ? dorzolamide-timolol (COSOPT) 22.3-6.8 MG/ML ophthalmic solution Place 1 drop into both eyes 2 (two) times daily.  ? LUMIGAN 0.01 % SOLN Place 1 drop into affected eye at bedtime.  ? Netarsudil-Latanoprost (ROCKLATAN) 0.02-0.005 % SOLN Place 1 drop into both eyes every evening.  ? Netarsudil-Latanoprost (ROCKLATAN) 0.02-0.005 % SOLN Place 1 drop into both eyes every evening.  ? VYZULTA 0.024 % SOLN Place 1 drop into both eyes at bedtime.  ? ?No current facility-administered medications for this visit. (Ophthalmic Drugs)  ? ?Current Outpatient Medications (Other)  ?Medication Sig  ? Budesonide (RHINOCORT ALLERGY NA) Place into the nose.  ? citalopram (CELEXA) 40 MG tablet Take 40 mg by mouth daily.  ? citalopram (CELEXA) 40 MG tablet TAKE 1 TABLET BY MOUTH ONCE DAILY.  ? citalopram (CELEXA) 40 MG tablet Take 1 tablet (40 mg total) by mouth daily.  ? citalopram (CELEXA) 40 MG tablet Take 1 tablet (40 mg total) by mouth daily.  ? glucosamine-chondroitin 500-400 MG tablet Take 1 tablet by mouth 3 (three) times daily.  ? glucose blood test strip Use as directed to check blood sugar once daily  ? Lancets (FREESTYLE) lancets use as  directed to check blood sugar daily  ? Multiple Vitamins-Minerals (PRESERVISION AREDS PO) Take by mouth.  ? rosuvastatin (CRESTOR) 5 MG tablet TAKE 1 TABLET BY MOUTH ONCE DAILY FOR CHOLESTEROL.  ? rosuvastatin (CRESTOR) 5 MG tablet Take 1 tablet (5 mg total) by mouth daily for cholesterol.  ? rosuvastatin (CRESTOR) 5 MG tablet Take 1 tablet (5 mg total) by mouth daily for cholesterol  ? ?No current facility-administered medications for this visit. (Other)  ? ? ? ? ?REVIEW OF SYSTEMS: ?ROS   ?Negative for: Constitutional, Gastrointestinal, Neurological, Skin, Genitourinary, Musculoskeletal, HENT, Endocrine, Cardiovascular, Eyes, Respiratory, Psychiatric, Allergic/Imm, Heme/Lymph ?Last edited by Silvestre Moment on 05/09/2021  8:04 AM.  ?  ? ? ? ?ALLERGIES ?Allergies  ?Allergen Reactions  ? Netarsudil-Latanoprost   ?  waiting on more info from doc office  ? ? ?PAST MEDICAL HISTORY ?Past Medical History:  ?Diagnosis Date  ? Diabetes mellitus without complication ([place])   ? Glaucoma   ? Sleep apnea   ? ?Past Surgical History:  ?Procedure Laterality Date  ? ANTERIOR CERVICAL DISCECTOMY    ? KNEE ARTHROSCOPY    ? ? ?FAMILY HISTORY ?History reviewed. No pertinent family history. ? ?SOCIAL HISTORY ?Social History  ? ?Tobacco Use  ? Smoking status: Never  ? Smokeless tobacco: Never  ?Substance Use Topics  ? Alcohol use: Yes  ?  Comment: 1-2 beers a week  ? ?  ? ?  ? ?OPHTHALMIC EXAM: ? ?  Base Eye Exam   ? ? Visual Acuity (ETDRS)   ? ?   Right Left  ? Dist [place] 20/20 -1 20/30 -2  ? Dist ph [place]  20/25 -2  ? ?  ?  ? ? Tonometry (Tonopen, 8:08 AM)   ? ?   Right Left  ? Pressure 13 16  ? ?  ?  ? ? Pupils   ? ?   Pupils APD  ? Right PERRL None  ? Left PERRL None  ? ?  ?  ? ? Visual Fields   ? ?   Left Right  ?  Full Full  ? ?  ?  ? ? Extraocular Movement   ? ?   Right Left  ?  Full Full  ? ?  ?  ? ? Neuro/Psych   ? ? Oriented x3: Yes  ? Mood/Affect: Normal  ? ?  ?  ? ? Dilation   ? ? Left eye: 2.5% Phenylephrine, 1.0% Mydriacyl @ 8:07 AM  ? ?  ?   ? ?  ? ?Slit Lamp and Fundus Exam   ? ? External Exam   ? ?   Right Left  ? External Normal Normal  ? ?  ?  ? ? Slit Lamp Exam   ? ?   Right Left  ? Lids/Lashes Normal Normal  ? Conjunctiva/Sclera 1+ Injection White and quiet  ? Cornea Clear Clear  ? Anterior Chamber Deep and quiet Deep and quiet  ? Iris Round and reactive Round and reactive  ? Lens Posterior chamber intraocular lens Posterior chamber intraocular lens  ? Anterior Vitreous Normal Normal  ? ?  ?  ? ? Fundus Exam   ? ?   Right Left  ? Posterior Vitreous  Central vitreous floaters  ? Disc  Normal  ? C/D Ratio  0.2  ? Macula  Hard drusen, Retinal pigment epithelial mottling, Pigmented atrophy with RPE atrophy in the foveal and subfoveal location,, , no macular thickening, no hemorrhage  ? Vessels  no DR  ? Periphery  Normal  ? ?  ?  ? ?  ? ? ?IMAGING AND PROCEDURES  ?Imaging and Procedures for 05/09/21 ? ?OCT, Retina - OU - Both Eyes   ? ?   ?Right Eye ?Quality was good. Scan locations included subfoveal. Central Foveal Thickness: 296. Progression has been stable. Findings include retinal drusen .  ? ?Left Eye ?Quality was good. Scan locations included subfoveal. Central Foveal Thickness: 303. Progression has improved. Findings include pigment epithelial detachment, abnormal foveal contour, retinal drusen .  ? ?Notes ?OS with sub RPe and subretinal fluid in the foveal location, yet much less subretinal fluid inferior to the fovea improved at this  9-week interval.  Post Avastin.  We will repeat injection with a change to Vabysmo ? ?OD stable ? ?  ? ?Intravitreal Injection, Pharmacologic Agent - OS - Left Eye   ? ?   ?Time Out ?05/09/2021. 8:57 AM. Confirmed correct patient, procedure, site, and patient consented.  ? ?Anesthesia ?Topical anesthesia was used. Anesthetic medications included Lidocaine 4%.  ? ?Procedure ?Preparation included Ofloxacin , 5% betadine to ocular surface, 10% betadine to eyelids, Tobramycin 0.3%. A 30 gauge needle was used.   ? ?Injection: ?6 mg faricimab-svoa 6 MG/0.05ML ?  Route: Intravitreal, Site: Left Eye ?  [place]: [id number], Lot: [id number], Waste: 0 mL  ? ?Post-op ?Post injection exam found visual acuity of at least counting fingers. The patient tolerated the procedure  well. There were no complications. The patient received written and verbal post procedure care education. Post injection medications were not given.  ? ?  ? ? ?  ?  ? ?  ?ASSESSMENT/PLAN: ? ?Exudative age-related macular degeneration of left eye with active choroidal neovascularization ([place]) ?Wet AMD, with residual subfoveal PED.  No intraretinal fluid today. ? ?We will change from Avastin today at 9-week interval now to Vabysmo ? ?Follow-up in 6 weeks for reevaluation of affect  ? ?  ICD-10-CM   ?1. Exudative age-related macular degeneration of left eye with active choroidal neovascularization (HCC)  H35.3221 OCT, Retina - OU - Both Eyes  ?  Intravitreal Injection, Pharmacologic Agent - OS - Left Eye  ?  faricimab-svoa (VABYSMO) '6mg'$ /0.54m intravitreal injection  ?  ? ? ?1.  OS today, doing well with no intraretinal fluid but residual pigment epithelial detachment subfoveal.  At 9 weeks post Avastin.  We will repeat injection today but change to Vabysmo and follow-up in 6 weeks to assess the efficacy ? ?2. ? ?3. ? ?Ophthalmic Meds Ordered this visit:  ?Meds ordered this encounter  ?Medications  ? faricimab-svoa (VABYSMO) '6mg'$ /0.046mintravitreal injection  ? ? ?  ? ?Return in about 6 weeks (around 06/20/2021) for dilate, OS, VABYSMO OCT. ? ?There are no Patient Instructions on file for this visit. ? ? ?Explained the diagnoses, plan, and follow up with the patient and they expressed understanding.  Patient expressed understanding of the importance of proper follow up care.  ? ?GaClent DemarkRankin M.D. ?Diseases & Surgery of the Retina and Vitreous ?[place]03/29/23 ? ? ? ? ?Abbreviations: ?M myopia (nearsighted); A astigmatism; H hyperopia  (farsighted); P presbyopia; Mrx spectacle prescription;  CTL contact lenses; OD right eye; OS left eye; OU both eyes  XT exotropia; ET esotropia; PEK punctate epithelial keratitis; PEE punctate epithelial erosions; DES dr

## 2021-05-09 NOTE — Assessment & Plan Note (Signed)
Wet AMD, with residual subfoveal PED.  No intraretinal fluid today. ? ?We will change from Avastin today at 9-week interval now to Vabysmo ? ?Follow-up in 6 weeks for reevaluation of affect ?

## 2021-05-10 ENCOUNTER — Other Ambulatory Visit (HOSPITAL_COMMUNITY): Payer: Self-pay

## 2021-05-30 ENCOUNTER — Other Ambulatory Visit (HOSPITAL_COMMUNITY): Payer: Self-pay

## 2021-06-02 DIAGNOSIS — G4733 Obstructive sleep apnea (adult) (pediatric): Secondary | ICD-10-CM | POA: Diagnosis not present

## 2021-06-20 ENCOUNTER — Other Ambulatory Visit (HOSPITAL_COMMUNITY): Payer: Self-pay

## 2021-06-20 DIAGNOSIS — F322 Major depressive disorder, single episode, severe without psychotic features: Secondary | ICD-10-CM | POA: Diagnosis not present

## 2021-06-20 DIAGNOSIS — G4733 Obstructive sleep apnea (adult) (pediatric): Secondary | ICD-10-CM | POA: Diagnosis not present

## 2021-06-20 DIAGNOSIS — E1169 Type 2 diabetes mellitus with other specified complication: Secondary | ICD-10-CM | POA: Diagnosis not present

## 2021-06-20 DIAGNOSIS — H3530 Unspecified macular degeneration: Secondary | ICD-10-CM | POA: Diagnosis not present

## 2021-06-20 DIAGNOSIS — Z125 Encounter for screening for malignant neoplasm of prostate: Secondary | ICD-10-CM | POA: Diagnosis not present

## 2021-06-20 DIAGNOSIS — E785 Hyperlipidemia, unspecified: Secondary | ICD-10-CM | POA: Diagnosis not present

## 2021-06-20 DIAGNOSIS — Z0000 Encounter for general adult medical examination without abnormal findings: Secondary | ICD-10-CM | POA: Diagnosis not present

## 2021-06-20 DIAGNOSIS — H409 Unspecified glaucoma: Secondary | ICD-10-CM | POA: Diagnosis not present

## 2021-06-20 MED ORDER — RYBELSUS 7 MG PO TABS
1.0000 | ORAL_TABLET | Freq: Every day | ORAL | 6 refills | Status: DC
  Filled 2021-06-20: qty 30, 30d supply, fill #0

## 2021-06-20 MED ORDER — CITALOPRAM HYDROBROMIDE 20 MG PO TABS
20.0000 mg | ORAL_TABLET | Freq: Every day | ORAL | 3 refills | Status: AC
Start: 2021-06-20 — End: ?
  Filled 2021-06-20 (×2): qty 90, 90d supply, fill #0
  Filled 2021-12-03: qty 90, 90d supply, fill #1
  Filled 2022-03-02: qty 90, 90d supply, fill #2
  Filled 2022-06-08: qty 90, 90d supply, fill #3

## 2021-06-20 MED ORDER — CITALOPRAM HYDROBROMIDE 20 MG PO TABS
10.0000 mg | ORAL_TABLET | Freq: Every day | ORAL | 3 refills | Status: DC
  Filled 2021-06-20: qty 45, 90d supply, fill #0

## 2021-06-27 ENCOUNTER — Encounter (INDEPENDENT_AMBULATORY_CARE_PROVIDER_SITE_OTHER): Payer: Self-pay | Admitting: Ophthalmology

## 2021-06-27 ENCOUNTER — Ambulatory Visit (INDEPENDENT_AMBULATORY_CARE_PROVIDER_SITE_OTHER): Payer: 59 | Admitting: Ophthalmology

## 2021-06-27 ENCOUNTER — Other Ambulatory Visit (HOSPITAL_COMMUNITY): Payer: Self-pay

## 2021-06-27 DIAGNOSIS — H353221 Exudative age-related macular degeneration, left eye, with active choroidal neovascularization: Secondary | ICD-10-CM | POA: Diagnosis not present

## 2021-06-27 DIAGNOSIS — H43821 Vitreomacular adhesion, right eye: Secondary | ICD-10-CM

## 2021-06-27 DIAGNOSIS — H43812 Vitreous degeneration, left eye: Secondary | ICD-10-CM | POA: Diagnosis not present

## 2021-06-27 MED ORDER — FARICIMAB-SVOA 6 MG/0.05ML IZ SOLN
6.0000 mg | INTRAVITREAL | Status: AC | PRN
  Administered 2021-06-27: 6 mg via INTRAVITREAL

## 2021-06-27 NOTE — Assessment & Plan Note (Signed)
OS, stable macular condition some 7 weeks post injection #1 of Vabysmo after change from Avastin. ? ?Repeat evaluation today discloses no real change in the configuration of the macula left eye stable acuity.  Subfoveal serous retinal detachment remains.  PVD incidentally has developed OS ?

## 2021-06-27 NOTE — Assessment & Plan Note (Signed)
Physiologic OD 

## 2021-06-27 NOTE — Assessment & Plan Note (Signed)
OS new onset with central vitreous floaters more pronounced now.  No holes or tears ?

## 2021-06-27 NOTE — Progress Notes (Signed)
? ? ?06/27/2021 ? ?  ? ?CHIEF COMPLAINT ?Patient presents for  ?Chief Complaint  ?Patient presents with  ? Macular Degeneration  ? ? ? ? ?HISTORY OF PRESENT ILLNESS: ?Nicholas Young is a 66 y.o. male who presents to the clinic today for:  ? ?HPI   ?7 weeks for DILATE, OS, VABYSMO, OCT. ?Pt states vision has been stable but expressed concerns about increased floaters. ?Pt reports, "About a week ago, I noticed an increase amount of floaters. It is big and cloudy and its in right in the center of my vision. It has not gone away." ?Pt denies FOL. ?Pt is allergyic to Netasurdil-Latanoprost. ? ?Last edited by Silvestre Moment on 06/27/2021  1:04 PM.  ?  ? ? ?Referring physician: ?Gaynelle Arabian, MD ?[address] ?[address] ?[place], [place],  [place] [address] ? ?HISTORICAL INFORMATION:  ? ?Selected notes from the [facility] ?  ?   ? ?CURRENT MEDICATIONS: ?Current Outpatient Medications (Ophthalmic Drugs)  ?Medication Sig  ? dorzolamide-timolol (COSOPT) 22.3-6.8 MG/ML ophthalmic solution Instill 1 drop into both eyes twice a day as directed  ? dorzolamide-timolol (COSOPT) 22.3-6.8 MG/ML ophthalmic solution Place 1 drop into both eyes 2 (two) times daily.  ? LUMIGAN 0.01 % SOLN Place 1 drop into affected eye at bedtime.  ? Netarsudil-Latanoprost (ROCKLATAN) 0.02-0.005 % SOLN Place 1 drop into both eyes every evening.  ? Netarsudil-Latanoprost (ROCKLATAN) 0.02-0.005 % SOLN Place 1 drop into both eyes every evening.  ? VYZULTA 0.024 % SOLN Place 1 drop into both eyes at bedtime.  ? ?No current facility-administered medications for this visit. (Ophthalmic Drugs)  ? ?Current Outpatient Medications (Other)  ?Medication Sig  ? Budesonide (RHINOCORT ALLERGY NA) Place into the nose.  ? citalopram (CELEXA) 20 MG tablet Take 1 tablet (20 mg total) by mouth daily.  ? citalopram (CELEXA) 40 MG tablet Take 40 mg by mouth daily.  ? citalopram (CELEXA) 40 MG tablet TAKE 1 TABLET BY MOUTH ONCE DAILY.  ? glucosamine-chondroitin 500-400 MG tablet Take  1 tablet by mouth 3 (three) times daily.  ? glucose blood test strip Use as directed to check blood sugar once daily  ? Lancets (FREESTYLE) lancets use as directed to check blood sugar daily  ? Multiple Vitamins-Minerals (PRESERVISION AREDS PO) Take by mouth.  ? rosuvastatin (CRESTOR) 5 MG tablet TAKE 1 TABLET BY MOUTH ONCE DAILY FOR CHOLESTEROL.  ? rosuvastatin (CRESTOR) 5 MG tablet Take 1 tablet (5 mg total) by mouth daily for cholesterol.  ? rosuvastatin (CRESTOR) 5 MG tablet Take 1 tablet (5 mg total) by mouth daily for cholesterol  ? Semaglutide (RYBELSUS) 7 MG TABS Take 1 tablet by mouth daily at least 30 minutes before first food, beverage or other oral medications of the day  ? ?No current facility-administered medications for this visit. (Other)  ? ? ? ? ?REVIEW OF SYSTEMS: ?ROS   ?Negative for: Constitutional, Gastrointestinal, Neurological, Skin, Genitourinary, Musculoskeletal, HENT, Endocrine, Cardiovascular, Eyes, Respiratory, Psychiatric, Allergic/Imm, Heme/Lymph ?Last edited by Silvestre Moment on 06/27/2021  1:04 PM.  ?  ? ? ? ?ALLERGIES ?Allergies  ?Allergen Reactions  ? Netarsudil-Latanoprost   ?  waiting on more info from doc office  ? ? ?PAST MEDICAL HISTORY ?Past Medical History:  ?Diagnosis Date  ? Diabetes mellitus without complication ([place])   ? Glaucoma   ? Sleep apnea   ? ?Past Surgical History:  ?Procedure Laterality Date  ? ANTERIOR CERVICAL DISCECTOMY    ? KNEE ARTHROSCOPY    ? ? ?FAMILY  HISTORY ?History reviewed. No pertinent family history. ? ?SOCIAL HISTORY ?Social History  ? ?Tobacco Use  ? Smoking status: Never  ? Smokeless tobacco: Never  ?Substance Use Topics  ? Alcohol use: Yes  ?  Comment: 1-2 beers a week  ? ?  ? ?  ? ?OPHTHALMIC EXAM: ? ?Base Eye Exam   ? ? Visual Acuity (ETDRS)   ? ?   Right Left  ? Dist [place] 20/20 20/25 +2  ? ?  ?  ? ? Tonometry (Tonopen, 1:08 PM)   ? ?   Right Left  ? Pressure 9 12  ? ?  ?  ? ? Pupils   ? ?   Pupils APD  ? Right PERRL None  ? Left PERRL None  ? ?  ?   ? ? Visual Fields   ? ?   Left Right  ?  Full Full  ? ?  ?  ? ? Extraocular Movement   ? ?   Right Left  ?  Full Full  ? ?  ?  ? ? Neuro/Psych   ? ? Oriented x3: Yes  ? Mood/Affect: Normal  ? ?  ?  ? ? Dilation   ? ? Left eye: 2.5% Phenylephrine, 1.0% Mydriacyl @ 1:08 PM  ? ?  ?  ? ?  ? ?Slit Lamp and Fundus Exam   ? ? External Exam   ? ?   Right Left  ? External Normal Normal  ? ?  ?  ? ? Slit Lamp Exam   ? ?   Right Left  ? Lids/Lashes Normal Normal  ? Conjunctiva/Sclera 1+ Injection White and quiet  ? Cornea Clear Clear  ? Anterior Chamber Deep and quiet Deep and quiet  ? Iris Round and reactive Round and reactive  ? Lens Posterior chamber intraocular lens Posterior chamber intraocular lens  ? Anterior Vitreous Normal Normal  ? ?  ?  ? ? Fundus Exam   ? ?   Right Left  ? Posterior Vitreous  Central vitreous floaters, Posterior vitreous detachment  ? Disc  Normal  ? C/D Ratio  0.2  ? Macula  Hard drusen, Retinal pigment epithelial mottling, Pigmented atrophy with RPE atrophy in the foveal and subfoveal location,, , no macular thickening, no hemorrhage  ? Vessels  no DR  ? Periphery  Normal, 20 diopter and 25 diopter exam no retinal holes or tears to the ora  ? ?  ?  ? ?  ? ? ?IMAGING AND PROCEDURES  ?Imaging and Procedures for 06/27/21 ? ?OCT, Retina - OU - Both Eyes   ? ?   ?Right Eye ?Quality was good. Scan locations included subfoveal. Central Foveal Thickness: 296. Progression has been stable. Findings include retinal drusen .  ? ?Left Eye ?Quality was good. Scan locations included subfoveal. Central Foveal Thickness: 307. Progression has improved. Findings include pigment epithelial detachment, abnormal foveal contour, retinal drusen .  ? ?Notes ?OS with sub RPe and subretinal fluid in the foveal location, yet much less subretinal fluid inferior to the fovea improved at this  7-week interval.  Post Vabysmo No. 1 we will repeat injection with continue with Vabysmo ? ?Apparent PVD OS  present ? ?OD stable ? ?   ? ?Intravitreal Injection, Pharmacologic Agent - OS - Left Eye   ? ?   ?Time Out ?06/27/2021. 1:35 PM. Confirmed correct patient, procedure, site, and patient consented.  ? ?Anesthesia ?Topical anesthesia was used. Anesthetic medications included Lidocaine 4%.  ? ?  Procedure ?Preparation included Ofloxacin , 5% betadine to ocular surface, 10% betadine to eyelids, Tobramycin 0.3%. A 30 gauge needle was used.  ? ?Injection: ?6 mg faricimab-svoa 6 MG/0.05ML ?  Route: Intravitreal, Site: Left Eye ?  [place]: [id number], Lot: [id number], Expiration date: 02/03/2022, Waste: 0 mL  ? ?Post-op ?Post injection exam found visual acuity of at least counting fingers. The patient tolerated the procedure well. There were no complications. The patient received written and verbal post procedure care education. Post injection medications were not given.  ? ?  ? ? ?  ?  ? ?  ?ASSESSMENT/PLAN: ? ?Exudative age-related macular degeneration of left eye with active choroidal neovascularization ([place]) ?OS, stable macular condition some 7 weeks post injection #1 of Vabysmo after change from Avastin. ? ?Repeat evaluation today discloses no real change in the configuration of the macula left eye stable acuity.  Subfoveal serous retinal detachment remains.  PVD incidentally has developed OS ? ?Vitreomacular adhesion of right eye ?Physiologic OD ? ?Posterior vitreous detachment of left eye ?OS new onset with central vitreous floaters more pronounced now.  No holes or tears  ? ?  ICD-10-CM   ?1. Exudative age-related macular degeneration of left eye with active choroidal neovascularization (HCC)  H35.3221 OCT, Retina - OU - Both Eyes  ?  Intravitreal Injection, Pharmacologic Agent - OS - Left Eye  ?  faricimab-svoa (VABYSMO) '6mg'$ /0.15m intravitreal injection  ?  ?2. Vitreomacular adhesion of right eye  H43.821   ?  ?3. Posterior vitreous detachment of left eye  H43.812   ?  ? ? ?1.  OS vastly improved yet stable with persistence of serous subretinal  fluid left eye.  On Vabysmo post injection #1 after demonstrable resistance over time to Avastin and other medications. ? ?2.  Recent PVD progression left eye may also decrease anterior posterior tracti

## 2021-07-02 DIAGNOSIS — G4733 Obstructive sleep apnea (adult) (pediatric): Secondary | ICD-10-CM | POA: Diagnosis not present

## 2021-07-19 ENCOUNTER — Other Ambulatory Visit (HOSPITAL_COMMUNITY): Payer: Self-pay

## 2021-07-19 MED ORDER — RYBELSUS 14 MG PO TABS
14.0000 mg | ORAL_TABLET | Freq: Every day | ORAL | 6 refills | Status: AC
  Filled 2021-07-19: qty 30, 30d supply, fill #0
  Filled 2021-08-13: qty 30, 30d supply, fill #1
  Filled 2021-09-12: qty 30, 30d supply, fill #2
  Filled 2021-10-14: qty 30, 30d supply, fill #3
  Filled 2021-11-16: qty 30, 30d supply, fill #4
  Filled 2021-12-11: qty 30, 30d supply, fill #5
  Filled 2022-01-11: qty 30, 30d supply, fill #6

## 2021-08-08 ENCOUNTER — Other Ambulatory Visit (HOSPITAL_COMMUNITY): Payer: Self-pay

## 2021-08-13 ENCOUNTER — Ambulatory Visit (INDEPENDENT_AMBULATORY_CARE_PROVIDER_SITE_OTHER): Payer: 59 | Admitting: Ophthalmology

## 2021-08-13 ENCOUNTER — Other Ambulatory Visit (HOSPITAL_COMMUNITY): Payer: Self-pay

## 2021-08-13 ENCOUNTER — Encounter (INDEPENDENT_AMBULATORY_CARE_PROVIDER_SITE_OTHER): Payer: Self-pay | Admitting: Ophthalmology

## 2021-08-13 DIAGNOSIS — E119 Type 2 diabetes mellitus without complications: Secondary | ICD-10-CM | POA: Diagnosis not present

## 2021-08-13 DIAGNOSIS — H353111 Nonexudative age-related macular degeneration, right eye, early dry stage: Secondary | ICD-10-CM | POA: Diagnosis not present

## 2021-08-13 DIAGNOSIS — H353221 Exudative age-related macular degeneration, left eye, with active choroidal neovascularization: Secondary | ICD-10-CM | POA: Diagnosis not present

## 2021-08-13 DIAGNOSIS — H35722 Serous detachment of retinal pigment epithelium, left eye: Secondary | ICD-10-CM | POA: Diagnosis not present

## 2021-08-13 DIAGNOSIS — H43392 Other vitreous opacities, left eye: Secondary | ICD-10-CM | POA: Diagnosis not present

## 2021-08-13 MED ORDER — FARICIMAB-SVOA 6 MG/0.05ML IZ SOLN
6.0000 mg | INTRAVITREAL | Status: AC | PRN
  Administered 2021-08-13: 6 mg via INTRAVITREAL

## 2021-08-13 NOTE — Assessment & Plan Note (Signed)
No detectable diabetic retinopathy left eye

## 2021-08-13 NOTE — Assessment & Plan Note (Signed)
OS hopefully the vitreous debris and floaters will move out of the line of sight asFurther vitreous syneresis develops.  Option for vitrectomy is always present in the pseudophakic eye  Notably, with multifocal IOL, symptomatic floaters tend to be significant in many cases

## 2021-08-13 NOTE — Assessment & Plan Note (Signed)
OD no sign of CNVM

## 2021-08-13 NOTE — Progress Notes (Signed)
08/13/2021     CHIEF COMPLAINT Patient presents for  Chief Complaint  Patient presents with   Macular Degeneration      HISTORY OF PRESENT ILLNESS: Nicholas Young is a 66 y.o. male who presents to the clinic today for:   HPI   At 7 weeks post most recent injection and had vegF medication OS.  No interval change in medical history or medication Last edited by Hurman Horn, MD on 08/13/2021  8:05 AM.      Referring physician: Gaynelle Arabian, MD [address],  [place] [address]  HISTORICAL INFORMATION:   Selected notes from the MEDICAL RECORD NUMBER       CURRENT MEDICATIONS: Current Outpatient Medications (Ophthalmic Drugs)  Medication Sig   dorzolamide-timolol (COSOPT) 22.3-6.8 MG/ML ophthalmic solution Instill 1 drop into both eyes twice a day as directed   dorzolamide-timolol (COSOPT) 22.3-6.8 MG/ML ophthalmic solution Place 1 drop into both eyes 2 (two) times daily.   LUMIGAN 0.01 % SOLN Place 1 drop into affected eye at bedtime.   Netarsudil-Latanoprost (ROCKLATAN) 0.02-0.005 % SOLN Place 1 drop into both eyes every evening.   Netarsudil-Latanoprost (ROCKLATAN) 0.02-0.005 % SOLN Place 1 drop into both eyes every evening.   VYZULTA 0.024 % SOLN Place 1 drop into both eyes at bedtime.   No current facility-administered medications for this visit. (Ophthalmic Drugs)   Current Outpatient Medications (Other)  Medication Sig   Budesonide (RHINOCORT ALLERGY NA) Place into the nose.   citalopram (CELEXA) 20 MG tablet Take 1 tablet (20 mg total) by mouth daily.   citalopram (CELEXA) 40 MG tablet Take 40 mg by mouth daily.   citalopram (CELEXA) 40 MG tablet TAKE 1 TABLET BY MOUTH ONCE DAILY.   glucosamine-chondroitin 500-400 MG tablet Take 1 tablet by mouth 3 (three) times daily.   glucose blood test strip Use as directed to check blood sugar once daily   Lancets (FREESTYLE) lancets use as directed to check blood sugar daily   Multiple  Vitamins-Minerals (PRESERVISION AREDS PO) Take by mouth.   rosuvastatin (CRESTOR) 5 MG tablet TAKE 1 TABLET BY MOUTH ONCE DAILY FOR CHOLESTEROL.   rosuvastatin (CRESTOR) 5 MG tablet Take 1 tablet (5 mg total) by mouth daily for cholesterol.   rosuvastatin (CRESTOR) 5 MG tablet Take 1 tablet (5 mg total) by mouth daily for cholesterol   Semaglutide (RYBELSUS) 14 MG TABS Take 1 tablet (14 mg total) by mouth daily at least 30 minutes before first food,beverage or other oral medications   No current facility-administered medications for this visit. (Other)      REVIEW OF SYSTEMS: ROS   Negative for: Constitutional, Gastrointestinal, Neurological, Skin, Genitourinary, Musculoskeletal, HENT, Endocrine, Cardiovascular, Eyes, Respiratory, Psychiatric, Allergic/Imm, Heme/Lymph Last edited by Hurman Horn, MD on 08/13/2021  8:04 AM.       ALLERGIES Allergies  Allergen Reactions   Netarsudil-Latanoprost     waiting on more info from doc office    PAST MEDICAL HISTORY Past Medical History:  Diagnosis Date   Diabetes mellitus without complication ([place])    Glaucoma    Sleep apnea    Past Surgical History:  Procedure Laterality Date   ANTERIOR CERVICAL DISCECTOMY     KNEE ARTHROSCOPY      FAMILY HISTORY History reviewed. No pertinent family history.  SOCIAL HISTORY Social History   Tobacco Use   Smoking status: Never   Smokeless tobacco: Never  Substance Use Topics   Alcohol use: Yes  Comment: 1-2 beers a week         OPHTHALMIC EXAM:  Base Eye Exam     Visual Acuity (ETDRS)       Right Left   Dist [place] 20/15 -1 20/25         Tonometry (Tonopen, 8:06 AM)       Right Left   Pressure 10 6         Pupils       Pupils APD   Right PERRL None   Left PERRL None         Visual Fields       Left Right    Full Full         Extraocular Movement       Right Left    Full, Ortho Full, Ortho         Neuro/Psych     Oriented x3: Yes    Mood/Affect: Normal         Dilation     Left eye: 1.0% Mydriacyl, 2.5% Phenylephrine @ 8:04 AM           Slit Lamp and Fundus Exam     External Exam       Right Left   External Normal Normal         Slit Lamp Exam       Right Left   Lids/Lashes Normal Normal   Conjunctiva/Sclera 1+ Injection White and quiet   Cornea Clear Clear   Anterior Chamber Deep and quiet Deep and quiet   Iris Round and reactive Round and reactive   Lens Posterior chamber intraocular lens Posterior chamber intraocular lens   Anterior Vitreous Normal Normal         Fundus Exam       Right Left   Posterior Vitreous  Central vitreous floaters, Posterior vitreous detachment   Disc  Normal   C/D Ratio  0.2   Macula  Hard drusen, Retinal pigment epithelial mottling, Pigmented atrophy with RPE atrophy in the foveal and subfoveal location,, , no macular thickening, no hemorrhage   Vessels  no DR   Periphery  Normal, 20 diopter and 25 diopter exam no retinal holes or tears to the ora            IMAGING AND PROCEDURES  Imaging and Procedures for 08/13/21  Intravitreal Injection, Pharmacologic Agent - OS - Left Eye       Time Out 08/13/2021. 8:34 AM. Confirmed correct patient, procedure, site, and patient consented.   Anesthesia Topical anesthesia was used. Anesthetic medications included Lidocaine 4%.   Procedure Preparation included 5% betadine to ocular surface, 10% betadine to eyelids, Tobramycin 0.3%, Ofloxacin . A 30 gauge needle was used.   Injection: 6 mg faricimab-svoa 6 MG/0.05ML   Route: Intravitreal, Site: Left Eye   [place]: [id number], Lot: [id number], Expiration date: 02/12/2023, Waste: 0 mL   Post-op Post injection exam found visual acuity of at least counting fingers. The patient tolerated the procedure well. There were no complications. The patient received written and verbal post procedure care education. Post injection medications were not given.      OCT, Retina  - OU - Both Eyes       Right Eye Quality was good. Scan locations included subfoveal. Central Foveal Thickness: 299. Progression has been stable. Findings include retinal drusen , vitreomacular adhesion .   Left Eye Quality was good. Scan locations included subfoveal. Central Foveal Thickness: 307. Progression has improved. Findings include abnormal  foveal contour, retinal drusen , pigment epithelial detachment.   Notes OS with sub RPe and subretinal fluid in the foveal location, yet much less subretinal fluid inferior to the fovea improved at this  7-week interval.  Post Vabysmo No. 2 we will repeat injection with continue with Vabysmo  Apparent PVD OS  present  OD stable             ASSESSMENT/PLAN:  Exudative age-related macular degeneration of left eye with active choroidal neovascularization (HCC) OS, stable condition macular findings now post Vabysmo, 2 injections.  Today at 7-week follow-up.  Repeat injection today follow-up next in 8 to 9 weeks  Early stage nonexudative age-related macular degeneration of right eye OD no sign of CNVM  Diabetes mellitus without complication (HCC) No detectable diabetic retinopathy left eye  Vitreous floaters of left eye OS hopefully the vitreous debris and floaters will move out of the line of sight asFurther vitreous syneresis develops.  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     ICD-10-CM   1. Exudative age-related macular degeneration of left eye with active choroidal neovascularization (HCC)  H35.3221 Intravitreal Injection, Pharmacologic Agent - OS - Left Eye    faricimab-svoa (VABYSMO) '6mg'$ /0.94m intravitreal injection    OCT, Retina - OU - Both Eyes    2. Early stage nonexudative age-related macular degeneration of right eye   H35.3111 OCT, Retina - OU - Both Eyes    3. [place] mellitus without complication ([place]  EZ61.0    4. Vitreous floaters of left eye  H43.392     5. Serous detachment of retinal pigment epithelium of left eye  H35.722       1.  OS repeat Vabysmo today to maintain resolution of intraretinal fluid resolution as well as subretinal fluid progression.  2.  No sign of recurrent CNVM OS today, chronic subfoveal PED remains.  3.  Discussion of floaters undertaken again.  Ophthalmic Meds Ordered this visit:  Meds ordered this encounter  Medications   faricimab-svoa (VABYSMO) '6mg'$ /0.063mintravitreal injection       Return in about 9 weeks (around 10/15/2021) for dilate, OS, VABYSMO OCT.  There are no Patient Instructions on file for this visit.   Explained the diagnoses, plan, and follow up with the patient and they expressed understanding.  Patient expressed understanding of the importance of proper follow up care.   GaClent Demarkankin M.D. Diseases & Surgery of the Retina and Vitreous Retina & Diabetic EyBeach Haven West7/03/23     Abbreviations: M myopia (nearsighted); A astigmatism; H hyperopia (farsighted); P presbyopia; Mrx spectacle prescription;  CTL contact lenses; OD right eye; OS left eye; OU both eyes  XT exotropia; ET esotropia; PEK punctate epithelial keratitis; PEE punctate epithelial erosions; DES dry eye syndrome; MGD meibomian gland dysfunction; ATs artificial tears; PFAT's preservative free artificial tears; [place] sclerotic cataract; PSC posterior subcapsular cataract; ERM epi-retinal membrane; PVD posterior vitreous detachment; RD retinal detachment; DM diabetes mellitus; DR diabetic retinopathy; NPDR non-proliferative diabetic retinopathy; PDR proliferative diabetic retinopathy; CSME clinically significant macular edema; DME diabetic macular edema; dbh dot blot hemorrhages; CWS cotton wool spot; POAG primary open angle glaucoma; C/D cup-to-disc ratio; HVF humphrey visual  field; GVF goldmann visual field; OCT optical coherence tomography; IOP intraocular pressure; BRVO Branch retinal vein occlusion; CRVO central retinal vein occlusion; CRAO  central retinal artery occlusion; BRAO branch retinal artery occlusion; RT retinal tear; SB scleral buckle; PPV pars plana vitrectomy; VH Vitreous hemorrhage; PRP panretinal laser photocoagulation; IVK intravitreal kenalog; VMT vitreomacular traction; MH Macular hole;  NVD neovascularization of the disc; NVE neovascularization elsewhere; AREDS age related eye disease study; ARMD age related macular degeneration; POAG primary open angle glaucoma; EBMD epithelial/anterior basement membrane dystrophy; ACIOL anterior chamber intraocular lens; IOL intraocular lens; PCIOL posterior chamber intraocular lens; Phaco/IOL phacoemulsification with intraocular lens placement; [place] photorefractive keratectomy; LASIK laser assisted in situ keratomileusis; HTN hypertension; DM diabetes mellitus; COPD chronic obstructive pulmonary disease

## 2021-08-13 NOTE — Assessment & Plan Note (Signed)
Chronic and subfoveal.

## 2021-08-13 NOTE — Assessment & Plan Note (Signed)
OS, stable condition macular findings now post Vabysmo, 2 injections.  Today at 7-week follow-up.  Repeat injection today follow-up next in 8 to 9 weeks

## 2021-08-15 ENCOUNTER — Other Ambulatory Visit (HOSPITAL_COMMUNITY): Payer: Self-pay

## 2021-09-13 ENCOUNTER — Other Ambulatory Visit (HOSPITAL_COMMUNITY): Payer: Self-pay

## 2021-09-25 ENCOUNTER — Other Ambulatory Visit (HOSPITAL_COMMUNITY): Payer: Self-pay

## 2021-09-26 ENCOUNTER — Other Ambulatory Visit (HOSPITAL_COMMUNITY): Payer: Self-pay

## 2021-09-26 MED ORDER — ROSUVASTATIN CALCIUM 5 MG PO TABS
5.0000 mg | ORAL_TABLET | Freq: Every day | ORAL | 0 refills | Status: AC
  Filled 2021-09-26: qty 90, 90d supply, fill #0

## 2021-10-01 DIAGNOSIS — G4733 Obstructive sleep apnea (adult) (pediatric): Secondary | ICD-10-CM | POA: Diagnosis not present

## 2021-10-02 ENCOUNTER — Other Ambulatory Visit (HOSPITAL_COMMUNITY): Payer: Self-pay

## 2021-10-16 ENCOUNTER — Encounter (INDEPENDENT_AMBULATORY_CARE_PROVIDER_SITE_OTHER): Payer: Self-pay | Admitting: Ophthalmology

## 2021-10-16 ENCOUNTER — Ambulatory Visit (INDEPENDENT_AMBULATORY_CARE_PROVIDER_SITE_OTHER): Payer: 59 | Admitting: Ophthalmology

## 2021-10-16 ENCOUNTER — Other Ambulatory Visit (HOSPITAL_COMMUNITY): Payer: Self-pay

## 2021-10-16 DIAGNOSIS — H353221 Exudative age-related macular degeneration, left eye, with active choroidal neovascularization: Secondary | ICD-10-CM | POA: Diagnosis not present

## 2021-10-16 DIAGNOSIS — H43392 Other vitreous opacities, left eye: Secondary | ICD-10-CM | POA: Diagnosis not present

## 2021-10-16 DIAGNOSIS — H35722 Serous detachment of retinal pigment epithelium, left eye: Secondary | ICD-10-CM | POA: Diagnosis not present

## 2021-10-16 MED ORDER — FARICIMAB-SVOA 6 MG/0.05ML IZ SOLN
6.0000 mg | INTRAVITREAL | Status: AC | PRN
  Administered 2021-10-16: 6 mg via INTRAVITREAL

## 2021-10-16 NOTE — Assessment & Plan Note (Signed)
Change to [facility] March 2023.  Currently at 9-week interval.  Small subfoveal pigment epithelial detachment with no overlying subretinal fluid nor intraretinal fluid throughout the posterior pole of macula.  Repeat injection today and extend interval examination next to 10 weeks

## 2021-10-16 NOTE — Progress Notes (Signed)
10/16/2021     CHIEF COMPLAINT Patient presents for  Chief Complaint  Patient presents with   Macular Degeneration      HISTORY OF PRESENT ILLNESS: Nicholas Young is a 66 y.o. male who presents to the clinic today for:   HPI   Today follow-up at 9 weeks postinjection intravitreal antivegF. , Only symptoms no change in acuity but still with floaters left eye Last edited by Hurman Horn, MD on 10/16/2021  8:22 AM.      Referring physician: Gaynelle Arabian, MD [address],  [place] [address]  HISTORICAL INFORMATION:   Selected notes from the MEDICAL RECORD NUMBER       CURRENT MEDICATIONS: Current Outpatient Medications (Ophthalmic Drugs)  Medication Sig   dorzolamide-timolol (COSOPT) 22.3-6.8 MG/ML ophthalmic solution Instill 1 drop into both eyes twice a day as directed   dorzolamide-timolol (COSOPT) 22.3-6.8 MG/ML ophthalmic solution Place 1 drop into both eyes 2 (two) times daily.   LUMIGAN 0.01 % SOLN Place 1 drop into affected eye at bedtime.   Netarsudil-Latanoprost (ROCKLATAN) 0.02-0.005 % SOLN Place 1 drop into both eyes every evening.   Netarsudil-Latanoprost (ROCKLATAN) 0.02-0.005 % SOLN Place 1 drop into both eyes every evening.   VYZULTA 0.024 % SOLN Place 1 drop into both eyes at bedtime.   No current facility-administered medications for this visit. (Ophthalmic Drugs)   Current Outpatient Medications (Other)  Medication Sig   Budesonide (RHINOCORT ALLERGY NA) Place into the nose.   citalopram (CELEXA) 20 MG tablet Take 1 tablet (20 mg total) by mouth daily.   citalopram (CELEXA) 40 MG tablet Take 40 mg by mouth daily.   citalopram (CELEXA) 40 MG tablet TAKE 1 TABLET BY MOUTH ONCE DAILY.   glucosamine-chondroitin 500-400 MG tablet Take 1 tablet by mouth 3 (three) times daily.   glucose blood test strip Use as directed to check blood sugar once daily   Lancets (FREESTYLE) lancets use as directed to check blood sugar daily   Multiple  Vitamins-Minerals (PRESERVISION AREDS PO) Take by mouth.   rosuvastatin (CRESTOR) 5 MG tablet TAKE 1 TABLET BY MOUTH ONCE DAILY FOR CHOLESTEROL.   rosuvastatin (CRESTOR) 5 MG tablet Take 1 tablet (5 mg total) by mouth daily for cholesterol.   rosuvastatin (CRESTOR) 5 MG tablet Take 1 tablet (5 mg total) by mouth daily for cholesterol   rosuvastatin (CRESTOR) 5 MG tablet Take 1 tablet (5 mg total) by mouth daily for cholesterol.   Semaglutide (RYBELSUS) 14 MG TABS Take 1 tablet (14 mg total) by mouth daily at least 30 minutes before first food,beverage or other oral medications   No current facility-administered medications for this visit. (Other)      REVIEW OF SYSTEMS: ROS   Negative for: Constitutional, Gastrointestinal, Neurological, Skin, Genitourinary, Musculoskeletal, HENT, Endocrine, Cardiovascular, Eyes, Respiratory, Psychiatric, Allergic/Imm, Heme/Lymph Last edited by Hurman Horn, MD on 10/16/2021  8:22 AM.       ALLERGIES Allergies  Allergen Reactions   Netarsudil-Latanoprost     waiting on more info from doc office    PAST MEDICAL HISTORY Past Medical History:  Diagnosis Date   Diabetes mellitus without complication ([place])    Glaucoma    Sleep apnea    Past Surgical History:  Procedure Laterality Date   ANTERIOR CERVICAL DISCECTOMY     KNEE ARTHROSCOPY      FAMILY HISTORY History reviewed. No pertinent family history.  SOCIAL HISTORY Social History   Tobacco Use   Smoking  status: Never   Smokeless tobacco: Never  Substance Use Topics   Alcohol use: Yes    Comment: 1-2 beers a week         OPHTHALMIC EXAM:  Base Eye Exam     Visual Acuity (ETDRS)       Right Left   Dist [place] 20/15 -1 20/30   Dist ph [place]  20/20 -2         Tonometry (Tonopen, 8:25 AM)       Right Left   Pressure 11 12         Pupils       Pupils   Right PERRL   Left PERRL         Visual Fields       Left Right    Full Full         Extraocular Movement        Right Left    Full, Ortho Full, Ortho         Neuro/Psych     Oriented x3: Yes   Mood/Affect: Normal         Dilation     Left eye: 1.0% Mydriacyl, 2.5% Phenylephrine @ 8:26 AM           Slit Lamp and Fundus Exam     External Exam       Right Left   External Normal Normal         Slit Lamp Exam       Right Left   Lids/Lashes Normal Normal   Conjunctiva/Sclera 1+ Injection White and quiet   Cornea Clear Clear   Anterior Chamber Deep and quiet Deep and quiet   Iris Round and reactive Round and reactive   Lens Posterior chamber intraocular lens Posterior chamber intraocular lens   Anterior Vitreous Normal Normal         Fundus Exam       Right Left   Posterior Vitreous  Central vitreous floaters, Posterior vitreous detachment   Disc  Normal   C/D Ratio  0.2   Macula  Hard drusen, Retinal pigment epithelial mottling, Pigmented atrophy with RPE atrophy in the foveal and subfoveal location, , no macular thickening, no hemorrhage   Vessels  no DR   Periphery  Normal, 20 diopter and 25 diopter exam no retinal holes or tears to the ora            IMAGING AND PROCEDURES  Imaging and Procedures for 10/16/21  Intravitreal Injection, Pharmacologic Agent - OS - Left Eye       Time Out 10/16/2021. 8:55 AM. Confirmed correct patient, procedure, site, and patient consented.   Anesthesia Topical anesthesia was used. Anesthetic medications included Lidocaine 4%.   Procedure Preparation included 5% betadine to ocular surface, 10% betadine to eyelids, Tobramycin 0.3%, Ofloxacin . A 30 gauge needle was used.   Injection: 6 mg faricimab-svoa 6 MG/0.05ML   Route: Intravitreal, Site: Left Eye   NDC: [id number], Lot: [id number], Expiration date: 07/13/2023, Waste: 0 mL   Post-op Post injection exam found visual acuity of at least counting fingers. The patient tolerated the procedure well. There were no complications. The patient received written and  verbal post procedure care education. Post injection medications were not given.      OCT, Retina - OU - Both Eyes       Right Eye Quality was good. Scan locations included subfoveal. Central Foveal Thickness: 296. Progression has been stable. Findings include retinal drusen ,  vitreomacular adhesion .   Left Eye Quality was good. Scan locations included subfoveal. Central Foveal Thickness: 300. Progression has improved. Findings include abnormal foveal contour, retinal drusen , pigment epithelial detachment.   Notes OS with sub RPe and subretinal fluid in the foveal location, yet much less subretinal fluid inferior to the fovea improved at this  9-week interval.  Post Vabysmo No. 3 we will repeat injection with continue with Vabysmo  Apparent PVD OS  present  OD stable             ASSESSMENT/PLAN:  Vitreous floaters of left eye The nature of vitreous floaters was discussed with the patient as well as their frequent occurrence with development of posterior vitreous detachment. The significance of flashes and field cuts was discussed with the patient. The need for a dilated fundus examination was discussed and advice given to return immediately for new or different floaters .  More uncommonly, vitreous floaters, debris, or clouds of haze may develop with impact on visual functioning.  If the personal impact to the patient is minor, observation usually results in diminishing symptoms.  If visual impact hampers activity of daily living over a period of time, medical interventions are available to change the condition, including laser vitreolysis or more commonly, surgical intervention to remove the visual impactful floaters.  No longer visually significant to the patient  Exudative age-related macular degeneration of left eye with active choroidal neovascularization [facility]) Change to [facility] March 2023.  Currently at 9-week interval.  Small subfoveal pigment epithelial detachment with no  overlying subretinal fluid nor intraretinal fluid throughout the posterior pole of macula.  Repeat injection today and extend interval examination next to 10 weeks      ICD-10-CM   1. Exudative age-related macular degeneration of left eye with active choroidal neovascularization (HCC)  H35.3221 Intravitreal Injection, Pharmacologic Agent - OS - Left Eye    OCT, Retina - OU - Both Eyes    faricimab-svoa (VABYSMO) '6mg'$ /0.38m intravitreal injection    2. Serous detachment of retinal pigment epithelium of left eye  H35.722 OCT, Retina - OU - Both Eyes    3. Vitreous floaters of left eye  H43.392       1.  OS, improved acute condition left eye on Vabysmo with no intraretinal subretinal fluid recurrence now at 9-week post injection interval.  Repeat today and extend interval next to 10 weeks.  2.  Preserved acuity  3.  OS no significant vitreous floaters hampering activities of daily living.  Ophthalmic Meds Ordered this visit:  Meds ordered this encounter  Medications   faricimab-svoa (VABYSMO) '6mg'$ /0.060mintravitreal injection       Return in about 10 weeks (around 12/25/2021) for DILATE OU, VABYSMO OCT, OS.  There are no Patient Instructions on file for this visit.   Explained the diagnoses, plan, and follow up with the patient and they expressed understanding.  Patient expressed understanding of the importance of proper follow up care.   GaClent Demarkankin M.D. Diseases & Surgery of the Retina and Vitreous Retina & Diabetic [facility]9/05/23     Abbreviations: M myopia (nearsighted); A astigmatism; H hyperopia (farsighted); P presbyopia; Mrx spectacle prescription;  CTL contact lenses; OD right eye; OS left eye; OU both eyes  XT exotropia; ET esotropia; PEK punctate epithelial keratitis; PEE punctate epithelial erosions; DES dry eye syndrome; MGD meibomian gland dysfunction; ATs artificial tears; PFAT's preservative free artificial tears; [place] sclerotic cataract; PSC  posterior subcapsular cataract; ERM epi-retinal membrane; PVD  posterior vitreous detachment; RD retinal detachment; DM diabetes mellitus; DR diabetic retinopathy; NPDR non-proliferative diabetic retinopathy; PDR proliferative diabetic retinopathy; CSME clinically significant macular edema; DME diabetic macular edema; dbh dot blot hemorrhages; CWS cotton wool spot; POAG primary open angle glaucoma; C/D cup-to-disc ratio; HVF humphrey visual field; GVF goldmann visual field; OCT optical coherence tomography; IOP intraocular pressure; BRVO Branch retinal vein occlusion; CRVO central retinal vein occlusion; CRAO central retinal artery occlusion; BRAO branch retinal artery occlusion; RT retinal tear; SB scleral buckle; PPV pars plana vitrectomy; VH Vitreous hemorrhage; PRP panretinal laser photocoagulation; IVK intravitreal kenalog; VMT vitreomacular traction; MH Macular hole;  NVD neovascularization of the disc; NVE neovascularization elsewhere; AREDS age related eye disease study; ARMD age related macular degeneration; POAG primary open angle glaucoma; EBMD epithelial/anterior basement membrane dystrophy; ACIOL anterior chamber intraocular lens; IOL intraocular lens; PCIOL posterior chamber intraocular lens; Phaco/IOL phacoemulsification with intraocular lens placement; [place] photorefractive keratectomy; LASIK laser assisted in situ keratomileusis; HTN hypertension; DM diabetes mellitus; COPD chronic obstructive pulmonary disease   10/16/2021     CHIEF COMPLAINT Patient presents for  Chief Complaint  Patient presents with   Macular Degeneration      HISTORY OF PRESENT ILLNESS: Nicholas Young is a 66 y.o. male who presents to the clinic today for:   HPI   Today follow-up at 9 weeks postinjection intravitreal antivegF. , Only symptoms no change in acuity but still with floaters left eye Last edited by Hurman Horn, MD on 10/16/2021  8:22 AM.      Referring physician: Gaynelle Arabian, MD [address],  [place] [address]  HISTORICAL INFORMATION:   Selected notes from the MEDICAL RECORD NUMBER       CURRENT MEDICATIONS: Current Outpatient Medications (Ophthalmic Drugs)  Medication Sig   dorzolamide-timolol (COSOPT) 22.3-6.8 MG/ML ophthalmic solution Instill 1 drop into both eyes twice a day as directed   dorzolamide-timolol (COSOPT) 22.3-6.8 MG/ML ophthalmic solution Place 1 drop into both eyes 2 (two) times daily.   LUMIGAN 0.01 % SOLN Place 1 drop into affected eye at bedtime.   Netarsudil-Latanoprost (ROCKLATAN) 0.02-0.005 % SOLN Place 1 drop into both eyes every evening.   Netarsudil-Latanoprost (ROCKLATAN) 0.02-0.005 % SOLN Place 1 drop into both eyes every evening.   VYZULTA 0.024 % SOLN Place 1 drop into both eyes at bedtime.   No current facility-administered medications for this visit. (Ophthalmic Drugs)   Current Outpatient Medications (Other)  Medication Sig   Budesonide (RHINOCORT ALLERGY NA) Place into the nose.   citalopram (CELEXA) 20 MG tablet Take 1 tablet (20 mg total) by mouth daily.   citalopram (CELEXA) 40 MG tablet Take 40 mg by mouth daily.   citalopram (CELEXA) 40 MG tablet TAKE 1 TABLET BY MOUTH ONCE DAILY.   glucosamine-chondroitin 500-400 MG tablet Take 1 tablet by mouth 3 (three) times daily.   glucose blood test strip Use as directed to check blood sugar once daily   Lancets (FREESTYLE) lancets use as directed to check blood sugar daily   Multiple Vitamins-Minerals (PRESERVISION AREDS PO) Take by mouth.   rosuvastatin (CRESTOR) 5 MG tablet TAKE 1 TABLET BY MOUTH ONCE DAILY FOR CHOLESTEROL.   rosuvastatin (CRESTOR) 5 MG tablet Take 1 tablet (5 mg total) by mouth daily for cholesterol.   rosuvastatin (CRESTOR) 5 MG tablet Take 1 tablet (5 mg total) by mouth daily for cholesterol   rosuvastatin (CRESTOR) 5 MG tablet Take 1 tablet (5 mg total) by mouth daily for cholesterol.  Semaglutide (RYBELSUS) 14 MG TABS Take 1 tablet (14 mg  total) by mouth daily at least 30 minutes before first food,beverage or other oral medications   No current facility-administered medications for this visit. (Other)      REVIEW OF SYSTEMS: ROS   Negative for: Constitutional, Gastrointestinal, Neurological, Skin, Genitourinary, Musculoskeletal, HENT, Endocrine, Cardiovascular, Eyes, Respiratory, Psychiatric, Allergic/Imm, Heme/Lymph Last edited by Hurman Horn, MD on 10/16/2021  8:22 AM.       ALLERGIES Allergies  Allergen Reactions   Netarsudil-Latanoprost     waiting on more info from doc office    PAST MEDICAL HISTORY Past Medical History:  Diagnosis Date   Diabetes mellitus without complication ([place])    Glaucoma    Sleep apnea    Past Surgical History:  Procedure Laterality Date   ANTERIOR CERVICAL DISCECTOMY     KNEE ARTHROSCOPY      FAMILY HISTORY History reviewed. No pertinent family history.  SOCIAL HISTORY Social History   Tobacco Use   Smoking status: Never   Smokeless tobacco: Never  Substance Use Topics   Alcohol use: Yes    Comment: 1-2 beers a week         OPHTHALMIC EXAM:  Base Eye Exam     Visual Acuity (ETDRS)       Right Left   Dist [place] 20/15 -1 20/30   Dist ph [place]  20/20 -2         Tonometry (Tonopen, 8:25 AM)       Right Left   Pressure 11 12         Pupils       Pupils   Right PERRL   Left PERRL         Visual Fields       Left Right    Full Full         Extraocular Movement       Right Left    Full, Ortho Full, Ortho         Neuro/Psych     Oriented x3: Yes   Mood/Affect: Normal         Dilation     Left eye: 1.0% Mydriacyl, 2.5% Phenylephrine @ 8:26 AM           Slit Lamp and Fundus Exam     External Exam       Right Left   External Normal Normal         Slit Lamp Exam       Right Left   Lids/Lashes Normal Normal   Conjunctiva/Sclera 1+ Injection White and quiet   Cornea Clear Clear   Anterior Chamber Deep and quiet  Deep and quiet   Iris Round and reactive Round and reactive   Lens Posterior chamber intraocular lens Posterior chamber intraocular lens   Anterior Vitreous Normal Normal         Fundus Exam       Right Left   Posterior Vitreous  Central vitreous floaters, Posterior vitreous detachment   Disc  Normal   C/D Ratio  0.2   Macula  Hard drusen, Retinal pigment epithelial mottling, Pigmented atrophy with RPE atrophy in the foveal and subfoveal location, , no macular thickening, no hemorrhage   Vessels  no DR   Periphery  Normal, 20 diopter and 25 diopter exam no retinal holes or tears to the ora            IMAGING AND PROCEDURES  Imaging and Procedures for  10/16/21  Intravitreal Injection, Pharmacologic Agent - OS - Left Eye       Time Out 10/16/2021. 8:55 AM. Confirmed correct patient, procedure, site, and patient consented.   Anesthesia Topical anesthesia was used. Anesthetic medications included Lidocaine 4%.   Procedure Preparation included 5% betadine to ocular surface, 10% betadine to eyelids, Tobramycin 0.3%, Ofloxacin . A 30 gauge needle was used.   Injection: 6 mg faricimab-svoa 6 MG/0.05ML   Route: Intravitreal, Site: Left Eye   NDC: [id number], Lot: [id number], Expiration date: 07/13/2023, Waste: 0 mL   Post-op Post injection exam found visual acuity of at least counting fingers. The patient tolerated the procedure well. There were no complications. The patient received written and verbal post procedure care education. Post injection medications were not given.      OCT, Retina - OU - Both Eyes       Right Eye Quality was good. Scan locations included subfoveal. Central Foveal Thickness: 296. Progression has been stable. Findings include retinal drusen , vitreomacular adhesion .   Left Eye Quality was good. Scan locations included subfoveal. Central Foveal Thickness: 300. Progression has improved. Findings include abnormal foveal contour, retinal drusen ,  pigment epithelial detachment.   Notes OS with sub RPe and subretinal fluid in the foveal location, yet much less subretinal fluid inferior to the fovea improved at this  9-week interval.  Post Vabysmo No. 3 we will repeat injection with continue with Vabysmo  Apparent PVD OS  present  OD stable             ASSESSMENT/PLAN:  Vitreous floaters of left eye The nature of vitreous floaters was discussed with the patient as well as their frequent occurrence with development of posterior vitreous detachment. The significance of flashes and field cuts was discussed with the patient. The need for a dilated fundus examination was discussed and advice given to return immediately for new or different floaters .  More uncommonly, vitreous floaters, debris, or clouds of haze may develop with impact on visual functioning.  If the personal impact to the patient is minor, observation usually results in diminishing symptoms.  If visual impact hampers activity of daily living over a period of time, medical interventions are available to change the condition, including laser vitreolysis or more commonly, surgical intervention to remove the visual impactful floaters.  No longer visually significant to the patient  Exudative age-related macular degeneration of left eye with active choroidal neovascularization [facility]) Change to [facility] March 2023.  Currently at 9-week interval.  Small subfoveal pigment epithelial detachment with no overlying subretinal fluid nor intraretinal fluid throughout the posterior pole of macula.  Repeat injection today and extend interval examination next to 10 weeks      ICD-10-CM   1. Exudative age-related macular degeneration of left eye with active choroidal neovascularization (HCC)  H35.3221 Intravitreal Injection, Pharmacologic Agent - OS - Left Eye    OCT, Retina - OU - Both Eyes    faricimab-svoa (VABYSMO) '6mg'$ /0.30m intravitreal injection    2. Serous detachment of retinal  pigment epithelium of left eye  H35.722 OCT, Retina - OU - Both Eyes    3. Vitreous floaters of left eye  H43.392       1.  2.  3.  Ophthalmic Meds Ordered this visit:  Meds ordered this encounter  Medications   faricimab-svoa (VABYSMO) '6mg'$ /0.05mintravitreal injection       Return in about 10 weeks (around 12/25/2021) for DILATE OU, VABYSMO OCT, OS.  There are  no Patient Instructions on file for this visit.   Explained the diagnoses, plan, and follow up with the patient and they expressed understanding.  Patient expressed understanding of the importance of proper follow up care.   Clent Demark Warnie Belair M.D. Diseases & Surgery of the Retina and Vitreous Retina & Diabetic [place] 10/16/21     Abbreviations: M myopia (nearsighted); A astigmatism; H hyperopia (farsighted); P presbyopia; Mrx spectacle prescription;  CTL contact lenses; OD right eye; OS left eye; OU both eyes  XT exotropia; ET esotropia; PEK punctate epithelial keratitis; PEE punctate epithelial erosions; DES dry eye syndrome; MGD meibomian gland dysfunction; ATs artificial tears; PFAT's preservative free artificial tears; [place] nuclear sclerotic cataract; PSC posterior subcapsular cataract; ERM epi-retinal membrane; PVD posterior vitreous detachment; RD retinal detachment; DM diabetes mellitus; DR diabetic retinopathy; NPDR non-proliferative diabetic retinopathy; PDR proliferative diabetic retinopathy; CSME clinically significant macular edema; DME diabetic macular edema; dbh dot blot hemorrhages; CWS cotton wool spot; POAG primary open angle glaucoma; C/D cup-to-disc ratio; HVF humphrey visual field; GVF goldmann visual field; OCT optical coherence tomography; IOP intraocular pressure; BRVO Branch retinal vein occlusion; CRVO central retinal vein occlusion; CRAO central retinal artery occlusion; BRAO branch retinal artery occlusion; RT retinal tear; SB scleral buckle; PPV pars plana vitrectomy; VH Vitreous hemorrhage; PRP  panretinal laser photocoagulation; IVK intravitreal kenalog; VMT vitreomacular traction; MH Macular hole;  NVD neovascularization of the disc; NVE neovascularization elsewhere; AREDS age related eye disease study; ARMD age related macular degeneration; POAG primary open angle glaucoma; EBMD epithelial/anterior basement membrane dystrophy; ACIOL anterior chamber intraocular lens; IOL intraocular lens; PCIOL posterior chamber intraocular lens; Phaco/IOL phacoemulsification with intraocular lens placement; [place] photorefractive keratectomy; LASIK laser assisted in situ keratomileusis; HTN hypertension; DM diabetes mellitus; COPD chronic obstructive pulmonary disease

## 2021-10-16 NOTE — Assessment & Plan Note (Signed)
The nature of vitreous floaters was discussed with the patient as well as their frequent occurrence with development of posterior vitreous detachment. The significance of flashes and field cuts was discussed with the patient. The need for a dilated fundus examination was discussed and advice given to return immediately for new or different floaters .  More uncommonly, vitreous floaters, debris, or clouds of haze may develop with impact on visual functioning.  If the personal impact to the patient is minor, observation usually results in diminishing symptoms.  If visual impact hampers activity of daily living over a period of time, medical interventions are available to change the condition, including laser vitreolysis or more commonly, surgical intervention to remove the visual impactful floaters.  No longer visually significant to the patient

## 2021-11-05 ENCOUNTER — Other Ambulatory Visit (HOSPITAL_COMMUNITY): Payer: Self-pay

## 2021-11-05 DIAGNOSIS — H401131 Primary open-angle glaucoma, bilateral, mild stage: Secondary | ICD-10-CM | POA: Diagnosis not present

## 2021-11-05 DIAGNOSIS — H35719 Central serous chorioretinopathy, unspecified eye: Secondary | ICD-10-CM | POA: Diagnosis not present

## 2021-11-05 MED ORDER — DORZOLAMIDE HCL-TIMOLOL MAL 2-0.5 % OP SOLN
1.0000 [drp] | Freq: Two times a day (BID) | OPHTHALMIC | 12 refills | Status: AC
  Filled 2021-11-05: qty 10, 50d supply, fill #0
  Filled 2022-01-03: qty 10, 50d supply, fill #1
  Filled 2022-02-25: qty 10, 50d supply, fill #2

## 2021-11-05 MED ORDER — LUMIGAN 0.01 % OP SOLN
1.0000 [drp] | Freq: Every evening | OPHTHALMIC | 12 refills | Status: AC
  Filled 2021-11-05: qty 7.5, 75d supply, fill #0
  Filled 2022-02-25: qty 7.5, 75d supply, fill #1
  Filled 2022-05-15 – 2022-05-17 (×2): qty 7.5, 75d supply, fill #2

## 2021-11-06 ENCOUNTER — Other Ambulatory Visit (HOSPITAL_COMMUNITY): Payer: Self-pay

## 2021-11-14 ENCOUNTER — Other Ambulatory Visit (HOSPITAL_COMMUNITY): Payer: Self-pay

## 2021-11-14 DIAGNOSIS — H01112 Allergic dermatitis of right lower eyelid: Secondary | ICD-10-CM | POA: Diagnosis not present

## 2021-11-14 DIAGNOSIS — H01115 Allergic dermatitis of left lower eyelid: Secondary | ICD-10-CM | POA: Diagnosis not present

## 2021-11-14 DIAGNOSIS — H10433 Chronic follicular conjunctivitis, bilateral: Secondary | ICD-10-CM | POA: Diagnosis not present

## 2021-11-14 MED ORDER — TRIAMCINOLONE ACETONIDE 0.1 % EX CREA
TOPICAL_CREAM | Freq: Two times a day (BID) | CUTANEOUS | 0 refills | Status: AC
  Filled 2021-11-14: qty 15, 30d supply, fill #0

## 2021-11-14 MED ORDER — PREDNISOLONE ACETATE 1 % OP SUSP
OPHTHALMIC | 0 refills | Status: AC
  Filled 2021-11-14: qty 5, 10d supply, fill #0

## 2021-11-16 ENCOUNTER — Other Ambulatory Visit (HOSPITAL_COMMUNITY): Payer: Self-pay

## 2021-11-29 DIAGNOSIS — G4733 Obstructive sleep apnea (adult) (pediatric): Secondary | ICD-10-CM | POA: Diagnosis not present

## 2021-12-03 ENCOUNTER — Other Ambulatory Visit (HOSPITAL_COMMUNITY): Payer: Self-pay

## 2021-12-03 MED ORDER — FREESTYLE LITE TEST VI STRP
ORAL_STRIP | 3 refills | Status: AC
  Filled 2021-12-03: qty 100, 90d supply, fill #0
  Filled 2022-03-02: qty 100, 90d supply, fill #1

## 2021-12-06 ENCOUNTER — Other Ambulatory Visit (HOSPITAL_COMMUNITY): Payer: Self-pay

## 2021-12-07 ENCOUNTER — Other Ambulatory Visit (HOSPITAL_COMMUNITY): Payer: Self-pay

## 2021-12-11 ENCOUNTER — Other Ambulatory Visit (HOSPITAL_COMMUNITY): Payer: Self-pay

## 2021-12-19 ENCOUNTER — Other Ambulatory Visit (HOSPITAL_COMMUNITY): Payer: Self-pay

## 2021-12-19 DIAGNOSIS — G4733 Obstructive sleep apnea (adult) (pediatric): Secondary | ICD-10-CM | POA: Diagnosis not present

## 2021-12-19 DIAGNOSIS — H409 Unspecified glaucoma: Secondary | ICD-10-CM | POA: Diagnosis not present

## 2021-12-19 DIAGNOSIS — E1169 Type 2 diabetes mellitus with other specified complication: Secondary | ICD-10-CM | POA: Diagnosis not present

## 2021-12-19 DIAGNOSIS — H3530 Unspecified macular degeneration: Secondary | ICD-10-CM | POA: Diagnosis not present

## 2021-12-19 DIAGNOSIS — R030 Elevated blood-pressure reading, without diagnosis of hypertension: Secondary | ICD-10-CM | POA: Diagnosis not present

## 2021-12-19 DIAGNOSIS — E785 Hyperlipidemia, unspecified: Secondary | ICD-10-CM | POA: Diagnosis not present

## 2021-12-19 DIAGNOSIS — Z86010 Personal history of colonic polyps: Secondary | ICD-10-CM | POA: Diagnosis not present

## 2021-12-19 DIAGNOSIS — F322 Major depressive disorder, single episode, severe without psychotic features: Secondary | ICD-10-CM | POA: Diagnosis not present

## 2021-12-19 MED ORDER — CITALOPRAM HYDROBROMIDE 20 MG PO TABS
20.0000 mg | ORAL_TABLET | Freq: Every day | ORAL | 3 refills | Status: AC
  Filled 2021-12-19: qty 90, 90d supply, fill #0

## 2021-12-19 MED ORDER — ROSUVASTATIN CALCIUM 5 MG PO TABS
5.0000 mg | ORAL_TABLET | Freq: Every day | ORAL | 3 refills | Status: AC
  Filled 2021-12-19 – 2022-03-24 (×2): qty 90, 90d supply, fill #0
  Filled 2022-06-22 – 2022-07-03 (×2): qty 90, 90d supply, fill #1

## 2021-12-19 MED ORDER — RYBELSUS 14 MG PO TABS
14.0000 mg | ORAL_TABLET | Freq: Every day | ORAL | 3 refills | Status: AC
  Filled 2021-12-19 – 2022-02-14 (×3): qty 90, 90d supply, fill #0
  Filled 2022-05-15 – 2022-05-17 (×2): qty 90, 90d supply, fill #1

## 2021-12-24 DIAGNOSIS — G4733 Obstructive sleep apnea (adult) (pediatric): Secondary | ICD-10-CM | POA: Diagnosis not present

## 2021-12-25 ENCOUNTER — Encounter (INDEPENDENT_AMBULATORY_CARE_PROVIDER_SITE_OTHER): Payer: 59 | Admitting: Ophthalmology

## 2021-12-26 DIAGNOSIS — H401132 Primary open-angle glaucoma, bilateral, moderate stage: Secondary | ICD-10-CM | POA: Diagnosis not present

## 2021-12-26 DIAGNOSIS — H353221 Exudative age-related macular degeneration, left eye, with active choroidal neovascularization: Secondary | ICD-10-CM | POA: Diagnosis not present

## 2021-12-26 DIAGNOSIS — G4733 Obstructive sleep apnea (adult) (pediatric): Secondary | ICD-10-CM | POA: Diagnosis not present

## 2021-12-26 DIAGNOSIS — H35712 Central serous chorioretinopathy, left eye: Secondary | ICD-10-CM | POA: Diagnosis not present

## 2021-12-26 DIAGNOSIS — E119 Type 2 diabetes mellitus without complications: Secondary | ICD-10-CM | POA: Diagnosis not present

## 2021-12-29 IMAGING — MR MR KNEE*R* W/O CM
7 series · 40 of 40 positions shown · non-contrast
Comparison: X-ray 05/24/2020

CLINICAL DATA: Right knee pain for 6 months

EXAM:
MRI OF THE RIGHT KNEE WITHOUT CONTRAST
TECHNIQUE: Multiplanar, multisequence MR imaging of the knee was performed. No
intravenous contrast was administered.

[Series 3: T2 fat-sat · axial · 4.0mm · 0.53mm/px · z∈[-86,+84]mm · 6 of 35 slices shown (1 of 3)]
[im 1/35]
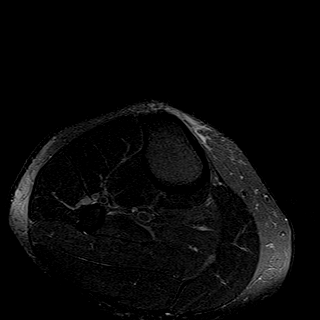
[im 7/35]
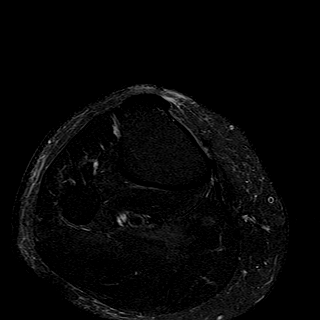
[im 14/35]
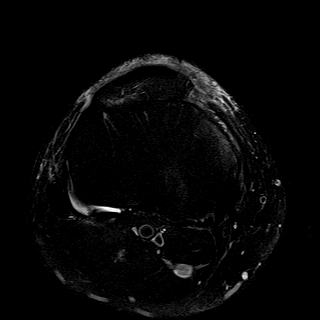
[im 21/35]
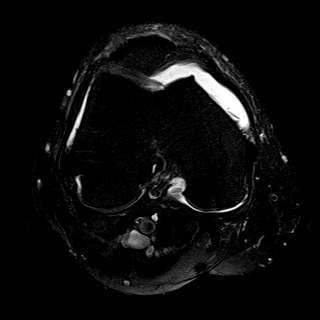
[im 28/35]
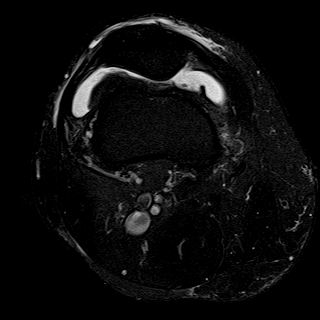
[im 35/35]
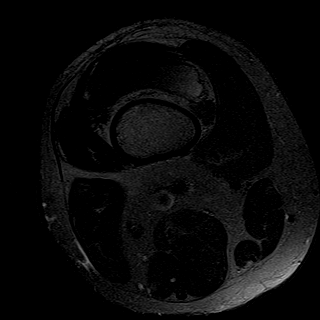

[Series 4: T1 · coronal · 4.0mm · 0.62mm/px · 6 of 31 slices shown]
[im 1/31]
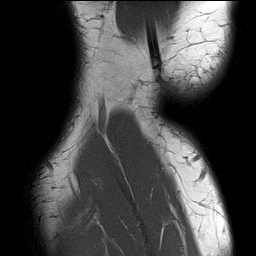
[im 7/31]
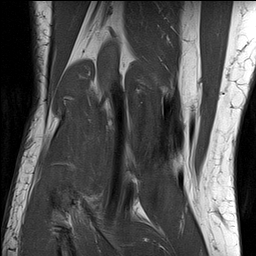
[im 13/31]
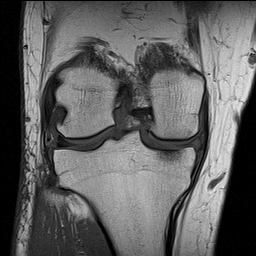
[im 19/31]
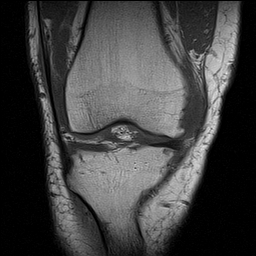
[im 25/31]
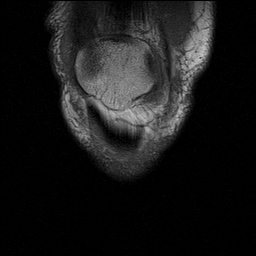
[im 31/31]
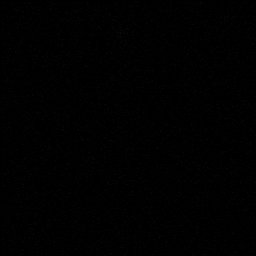

[Series 5: T2 fat-sat · coronal · 4.0mm · 0.62mm/px · 6 of 31 slices shown (2 of 3)]
[im 1/31]
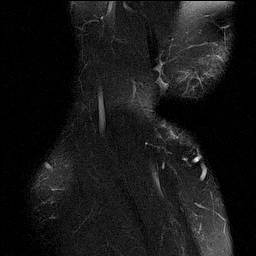
[im 7/31]
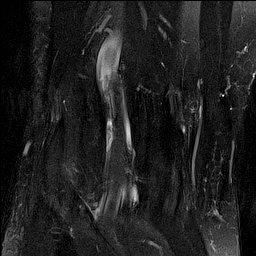
[im 13/31]
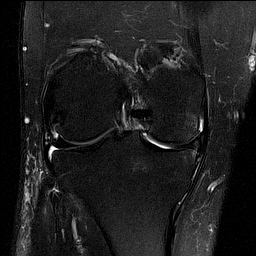
[im 19/31]
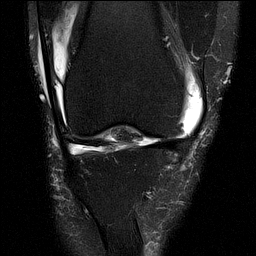
[im 25/31]
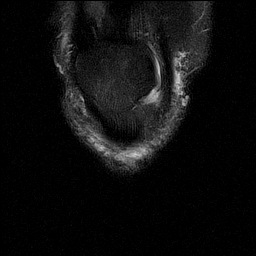
[im 31/31]
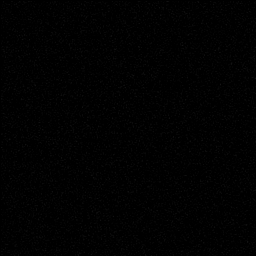

[Series 6: PD fat-sat · coronal · 4.0mm · 0.62mm/px · 6 of 31 slices shown (1 of 3)]
[im 1/31]
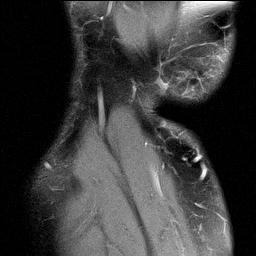
[im 7/31]
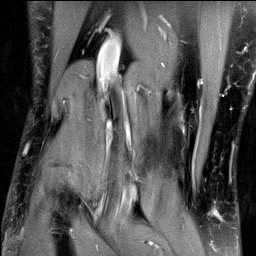
[im 13/31]
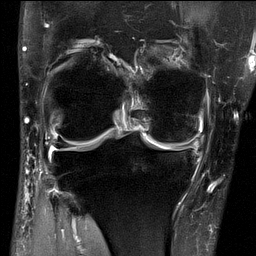
[im 19/31]
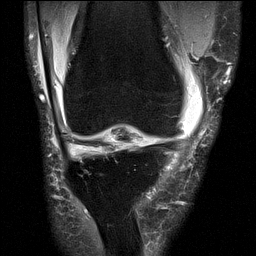
[im 25/31]
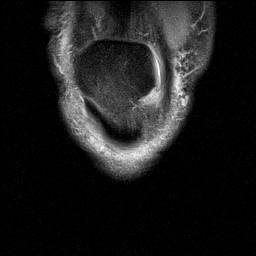
[im 31/31]
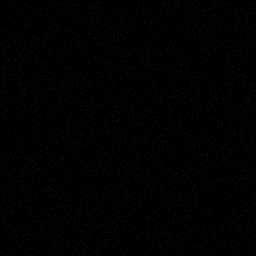

[Series 7: PD fat-sat · sagittal · 3.0mm · 0.62mm/px · 6 of 35 slices shown (2 of 3)]
[im 1/35]
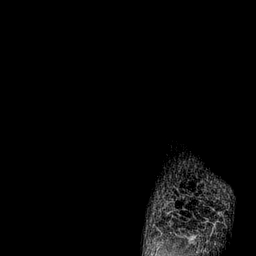
[im 7/35]
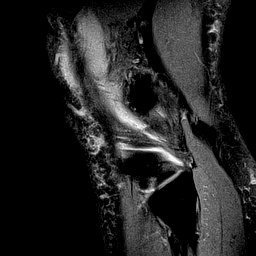
[im 14/35]
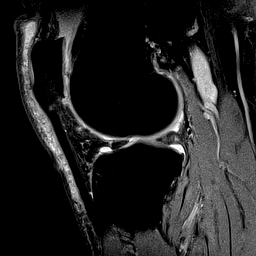
[im 21/35]
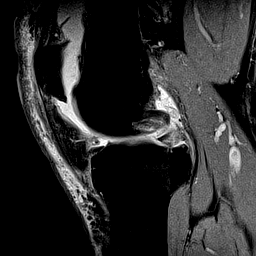
[im 28/35]
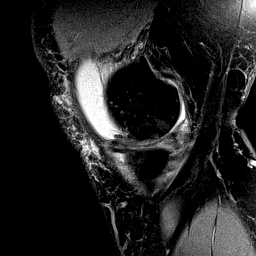
[im 35/35]
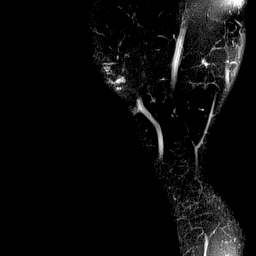

[Series 8: T2 fat-sat · sagittal · 3.0mm · 0.62mm/px · 6 of 35 slices shown (3 of 3)]
[im 1/35]
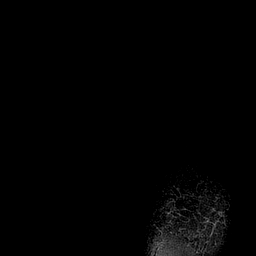
[im 7/35]
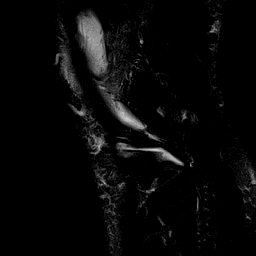
[im 14/35]
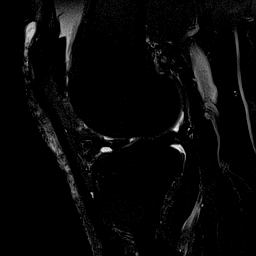
[im 21/35]
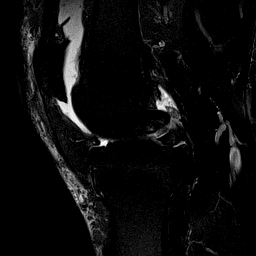
[im 28/35]
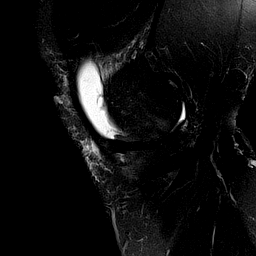
[im 35/35]
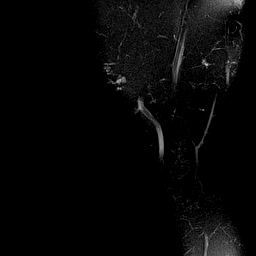

[Series 9: PD fat-sat · oblique · 2.0mm · 0.62mm/px · 4 of 19 slices shown (3 of 3)]
[im 1/19]
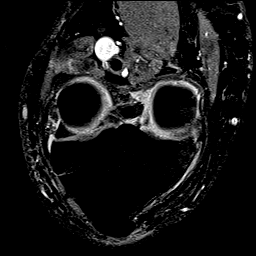
[im 7/19]
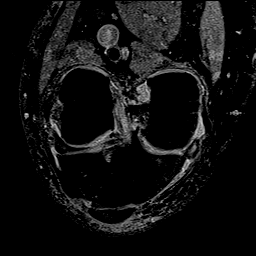
[im 13/19]
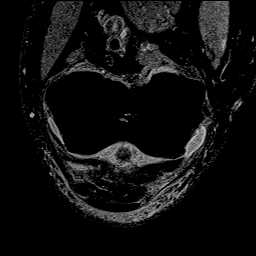
[im 19/19]
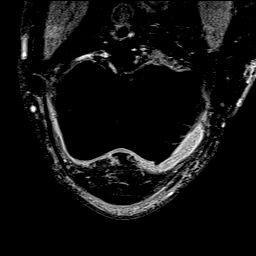

[40 of 40 positions shown; findings below may reference images not displayed]

FINDINGS: MENISCI

Medial meniscus: Extensive complex tearing of the posterior horn and
body segments of the medial meniscus including vertical tear
component involving the periphery of the mid body and radial
component of the free edge of the posterior horn. Meniscal body is
medially extruded.

Lateral meniscus:  Intrasubstance degeneration without focal tear.

LIGAMENTS

Cruciates:  Intact ACL and PCL.

Collaterals: Medial collateral ligament is intact. Lateral
collateral ligament complex is intact.

CARTILAGE

Patellofemoral: Mild diffuse cartilage thinning with scattered areas
of partial and full-thickness chondral fissuring of the lateral
patellar facet and patellar apex. Additional partial-thickness
cartilage fissuring within the central trochlear groove.

Medial: Moderate chondral thinning and surface irregularity most
pronounced at the periphery of the weight-bearing medial compartment
with associated subchondral marrow edema.

Lateral:  No chondral defect.

Joint: Moderate sized knee joint effusion. Fat pads within normal
limits.

Popliteal Fossa:  No Baker cyst. Intact popliteus tendon.

Extensor Mechanism:  Intact quadriceps tendon and patellar tendon.

Bones: Tricompartmental joint space narrowing with marginal
osteophyte formation, most pronounced within the medial compartment.
Reactive subchondral marrow signal changes within the medial
compartment. No subchondral fracture. No suspicious bone lesion.

Other: Prepatellar subcutaneous edema with small superficial fluid
collection measuring 3.5 x 0.7 x 1.8 cm.
IMPRESSION: 1. Extensive complex tearing of the medial meniscus.
2. Moderate medial and patellofemoral compartment osteoarthritis.
3. Moderate sized knee joint effusion.
4. Mild prepatellar bursitis.

## 2022-01-04 ENCOUNTER — Other Ambulatory Visit (HOSPITAL_COMMUNITY): Payer: Self-pay

## 2022-01-07 ENCOUNTER — Other Ambulatory Visit (HOSPITAL_COMMUNITY): Payer: Self-pay

## 2022-01-07 MED ORDER — IRBESARTAN 75 MG PO TABS
75.0000 mg | ORAL_TABLET | Freq: Every day | ORAL | 3 refills | Status: AC
  Filled 2022-01-07: qty 90, 90d supply, fill #0

## 2022-01-11 ENCOUNTER — Other Ambulatory Visit (HOSPITAL_COMMUNITY): Payer: Self-pay

## 2022-01-16 ENCOUNTER — Other Ambulatory Visit (HOSPITAL_COMMUNITY): Payer: Self-pay

## 2022-02-11 ENCOUNTER — Other Ambulatory Visit: Payer: Self-pay

## 2022-02-12 ENCOUNTER — Other Ambulatory Visit (HOSPITAL_COMMUNITY): Payer: Self-pay

## 2022-02-14 ENCOUNTER — Other Ambulatory Visit (HOSPITAL_COMMUNITY): Payer: Self-pay

## 2022-02-14 DIAGNOSIS — I10 Essential (primary) hypertension: Secondary | ICD-10-CM | POA: Diagnosis not present

## 2022-02-25 ENCOUNTER — Other Ambulatory Visit: Payer: Self-pay

## 2022-02-25 ENCOUNTER — Other Ambulatory Visit (HOSPITAL_COMMUNITY): Payer: Self-pay

## 2022-03-02 ENCOUNTER — Other Ambulatory Visit (HOSPITAL_COMMUNITY): Payer: Self-pay

## 2022-03-04 ENCOUNTER — Other Ambulatory Visit (HOSPITAL_COMMUNITY): Payer: Self-pay

## 2022-03-04 ENCOUNTER — Other Ambulatory Visit: Payer: Self-pay

## 2022-03-04 MED ORDER — FREESTYLE LANCETS MISC
1.0000 | Freq: Every day | 3 refills | Status: AC
  Filled 2022-03-04: qty 100, 90d supply, fill #0

## 2022-03-07 DIAGNOSIS — H401132 Primary open-angle glaucoma, bilateral, moderate stage: Secondary | ICD-10-CM | POA: Diagnosis not present

## 2022-03-07 DIAGNOSIS — H353221 Exudative age-related macular degeneration, left eye, with active choroidal neovascularization: Secondary | ICD-10-CM | POA: Diagnosis not present

## 2022-03-07 DIAGNOSIS — H353111 Nonexudative age-related macular degeneration, right eye, early dry stage: Secondary | ICD-10-CM | POA: Diagnosis not present

## 2022-03-07 DIAGNOSIS — E119 Type 2 diabetes mellitus without complications: Secondary | ICD-10-CM | POA: Diagnosis not present

## 2022-03-07 DIAGNOSIS — H35712 Central serous chorioretinopathy, left eye: Secondary | ICD-10-CM | POA: Diagnosis not present

## 2022-03-11 ENCOUNTER — Other Ambulatory Visit (HOSPITAL_COMMUNITY): Payer: Self-pay

## 2022-03-11 MED ORDER — IRBESARTAN 150 MG PO TABS
150.0000 mg | ORAL_TABLET | Freq: Every day | ORAL | 2 refills | Status: AC
  Filled 2022-03-11: qty 90, 90d supply, fill #0
  Filled 2022-06-08: qty 90, 90d supply, fill #1

## 2022-03-12 ENCOUNTER — Other Ambulatory Visit (HOSPITAL_COMMUNITY): Payer: Self-pay

## 2022-03-12 MED ORDER — PEG 3350-KCL-NA BICARB-NACL 420 G PO SOLR
ORAL | 0 refills | Status: AC
  Filled 2022-03-12: qty 4000, 1d supply, fill #0

## 2022-03-14 ENCOUNTER — Other Ambulatory Visit (HOSPITAL_COMMUNITY): Payer: Self-pay

## 2022-03-14 DIAGNOSIS — H401132 Primary open-angle glaucoma, bilateral, moderate stage: Secondary | ICD-10-CM | POA: Diagnosis not present

## 2022-03-14 DIAGNOSIS — H01116 Allergic dermatitis of left eye, unspecified eyelid: Secondary | ICD-10-CM | POA: Diagnosis not present

## 2022-03-14 MED ORDER — TIMOLOL MALEATE 0.5 % OP SOLN
1.0000 [drp] | Freq: Two times a day (BID) | OPHTHALMIC | 6 refills | Status: AC
  Filled 2022-03-14: qty 5, 30d supply, fill #0

## 2022-03-25 ENCOUNTER — Other Ambulatory Visit (HOSPITAL_COMMUNITY): Payer: Self-pay

## 2022-03-25 ENCOUNTER — Other Ambulatory Visit: Payer: Self-pay

## 2022-03-25 DIAGNOSIS — H401131 Primary open-angle glaucoma, bilateral, mild stage: Secondary | ICD-10-CM | POA: Diagnosis not present

## 2022-03-25 DIAGNOSIS — H35719 Central serous chorioretinopathy, unspecified eye: Secondary | ICD-10-CM | POA: Diagnosis not present

## 2022-03-29 DIAGNOSIS — H401131 Primary open-angle glaucoma, bilateral, mild stage: Secondary | ICD-10-CM | POA: Diagnosis not present

## 2022-03-29 DIAGNOSIS — H35719 Central serous chorioretinopathy, unspecified eye: Secondary | ICD-10-CM | POA: Diagnosis not present

## 2022-03-29 DIAGNOSIS — H26493 Other secondary cataract, bilateral: Secondary | ICD-10-CM | POA: Diagnosis not present

## 2022-04-02 ENCOUNTER — Other Ambulatory Visit (HOSPITAL_COMMUNITY): Payer: Self-pay

## 2022-04-03 ENCOUNTER — Other Ambulatory Visit (HOSPITAL_COMMUNITY): Payer: Self-pay

## 2022-04-11 DIAGNOSIS — Z09 Encounter for follow-up examination after completed treatment for conditions other than malignant neoplasm: Secondary | ICD-10-CM | POA: Diagnosis not present

## 2022-04-11 DIAGNOSIS — Z86010 Personal history of colonic polyps: Secondary | ICD-10-CM | POA: Diagnosis not present

## 2022-04-11 DIAGNOSIS — K648 Other hemorrhoids: Secondary | ICD-10-CM | POA: Diagnosis not present

## 2022-04-11 DIAGNOSIS — K5730 Diverticulosis of large intestine without perforation or abscess without bleeding: Secondary | ICD-10-CM | POA: Diagnosis not present

## 2022-04-18 DIAGNOSIS — M7989 Other specified soft tissue disorders: Secondary | ICD-10-CM | POA: Diagnosis not present

## 2022-04-18 DIAGNOSIS — R600 Localized edema: Secondary | ICD-10-CM | POA: Diagnosis not present

## 2022-04-18 DIAGNOSIS — Z7984 Long term (current) use of oral hypoglycemic drugs: Secondary | ICD-10-CM | POA: Diagnosis not present

## 2022-04-18 DIAGNOSIS — M79662 Pain in left lower leg: Secondary | ICD-10-CM | POA: Diagnosis not present

## 2022-04-18 DIAGNOSIS — E119 Type 2 diabetes mellitus without complications: Secondary | ICD-10-CM | POA: Diagnosis not present

## 2022-04-18 DIAGNOSIS — L03115 Cellulitis of right lower limb: Secondary | ICD-10-CM | POA: Diagnosis not present

## 2022-05-16 ENCOUNTER — Other Ambulatory Visit (HOSPITAL_COMMUNITY): Payer: Self-pay

## 2022-05-17 ENCOUNTER — Other Ambulatory Visit (HOSPITAL_COMMUNITY): Payer: Self-pay

## 2022-05-20 ENCOUNTER — Other Ambulatory Visit (HOSPITAL_COMMUNITY): Payer: Self-pay

## 2022-05-22 ENCOUNTER — Other Ambulatory Visit (HOSPITAL_COMMUNITY): Payer: Self-pay

## 2022-05-30 ENCOUNTER — Other Ambulatory Visit: Payer: Self-pay

## 2022-06-24 ENCOUNTER — Other Ambulatory Visit: Payer: Self-pay

## 2022-07-03 ENCOUNTER — Other Ambulatory Visit: Payer: Self-pay

## 2022-07-03 ENCOUNTER — Other Ambulatory Visit (HOSPITAL_COMMUNITY): Payer: Self-pay

## 2022-07-03 ENCOUNTER — Encounter: Payer: Self-pay | Admitting: Pharmacist

## 2022-07-15 ENCOUNTER — Other Ambulatory Visit (HOSPITAL_COMMUNITY): Payer: Self-pay

## 2022-08-01 ENCOUNTER — Other Ambulatory Visit (HOSPITAL_COMMUNITY): Payer: Self-pay

## 2022-12-17 ENCOUNTER — Other Ambulatory Visit (HOSPITAL_COMMUNITY): Payer: Self-pay

## 2022-12-17 MED ORDER — BIMATOPROST 0.03 % OP SOLN
1.0000 [drp] | Freq: Every evening | OPHTHALMIC | 3 refills | Status: AC
  Filled 2022-12-17: qty 5, 30d supply, fill #0
  Filled 2022-12-19: qty 5, 50d supply, fill #0

## 2022-12-19 ENCOUNTER — Other Ambulatory Visit (HOSPITAL_COMMUNITY): Payer: Self-pay

## 2022-12-31 ENCOUNTER — Other Ambulatory Visit (HOSPITAL_COMMUNITY): Payer: Self-pay

## 2023-03-19 ENCOUNTER — Other Ambulatory Visit (HOSPITAL_COMMUNITY): Payer: Self-pay

## 2023-08-29 ENCOUNTER — Encounter: Payer: Self-pay | Admitting: Advanced Practice Midwife
